# Patient Record
Sex: Female | Born: 1975
Health system: Southern US, Community
[De-identification: ages and names within clinical notes are randomized; demographics above are authoritative.]

## PROBLEM LIST (undated history)

## (undated) DIAGNOSIS — Z9049 Acquired absence of other specified parts of digestive tract: Secondary | ICD-10-CM

## (undated) DIAGNOSIS — I10 Essential (primary) hypertension: Principal | ICD-10-CM

## (undated) HISTORY — PX: BACK SURGERY: SHX140

## (undated) HISTORY — PX: KNEE ARTHROSCOPY: SHX127

## (undated) HISTORY — PX: HERNIA REPAIR: SHX51

## (undated) HISTORY — DX: Acquired absence of other specified parts of digestive tract: Z90.49

## (undated) HISTORY — PX: APPENDECTOMY: SHX54

## (undated) HISTORY — DX: Essential (primary) hypertension: I10

---

## 2012-01-14 ENCOUNTER — Emergency Department (HOSPITAL_COMMUNITY)
Admission: EM | Admit: 2012-01-14 | Discharge: 2012-01-14 | Disposition: A | Discharge status: Home / Self Care | Payer: 59 | Attending: Emergency Medicine | Admitting: Emergency Medicine

## 2012-01-14 ENCOUNTER — Encounter (HOSPITAL_COMMUNITY): Payer: Self-pay | Admitting: Emergency Medicine

## 2012-01-14 DIAGNOSIS — R51 Headache: Secondary | ICD-10-CM | POA: Insufficient documentation

## 2012-01-14 DIAGNOSIS — F17200 Nicotine dependence, unspecified, uncomplicated: Secondary | ICD-10-CM | POA: Insufficient documentation

## 2012-01-14 MED ORDER — DEXAMETHASONE SODIUM PHOSPHATE 10 MG/ML IJ SOLN
10.0000 mg | Freq: Once | INTRAMUSCULAR | Status: AC
Start: 2012-01-14 — End: 2012-01-14
  Administered 2012-01-14: 10 mg via INTRAMUSCULAR
  Filled 2012-01-14: qty 1

## 2012-01-14 MED ORDER — KETOROLAC TROMETHAMINE 60 MG/2ML IM SOLN
60.0000 mg | Freq: Once | INTRAMUSCULAR | Status: AC
  Administered 2012-01-14: 60 mg via INTRAMUSCULAR
  Filled 2012-01-14: qty 2

## 2012-01-14 NOTE — ED Provider Notes (Signed)
History     CSN: [id number]  Arrival date & time 01/14/12  0907   First MD Initiated Contact with Patient 01/14/12 [id number]      Chief Complaint  Patient presents with  . Headache    (Consider location/radiation/quality/duration/timing/severity/associated sxs/prior treatment) HPI  Pt presents to the ED with complaints of headache. Pt was seen at an Urgent Care yesterday for severe headache and elevated BP. The patients blood pressure is normally 120/80 and recently has been between 130-147/80-90. Pt was given an unknown pain shot and Phenergan. She states that her headache today is mild but she checked her blood pressure and its in the mid 130's which is concerning to her since its normally not elevated. SHe denies focal or generalized weakness, change in vision, difficulty talking/walking, denies photo- and phonophobia. Pt has a history of having headaches and this is similar to the same. She mainly came in concern of having HBP and HA at the same time.  History reviewed. No pertinent past medical history.  Past Surgical History  Procedure Date  . Back surgery     No family history on file.  History  Substance Use Topics  . Smoking status: Current Everyday Smoker  . Smokeless tobacco: Not on file  . Alcohol Use: Yes    OB History    Grav Para Term Preterm Abortions TAB SAB Ect Mult Living                  Review of Systems  All other systems reviewed and are negative.    Allergies  Review of patient's allergies indicates no known allergies.  Home Medications   Current Outpatient Rx  Name Route Sig Dispense Refill  . ASPIRIN 325 MG PO TABS Oral Take 325-650 mg by mouth every 4 (four) hours as needed. For headache    . HAIR/SKIN/NAILS PO Oral Take 1 tablet by mouth daily.      BP 147/80  Pulse 62  Temp(Src) 98.3 F (36.8 C) (Oral)  Resp 20  SpO2 95%  Physical Exam  Nursing note and vitals reviewed. Constitutional: She appears well-developed and  well-nourished.  HENT:  Head: Normocephalic and atraumatic.  Eyes: Conjunctivae are normal. Pupils are equal, round, and reactive to light.  Neck: Trachea normal, normal range of motion and full passive range of motion without pain. Neck supple.  Cardiovascular: Normal rate, regular rhythm and normal pulses.   Pulmonary/Chest: Effort normal and breath sounds normal. Chest wall is not dull to percussion. She exhibits no tenderness, no crepitus, no edema, no deformity and no retraction.  Abdominal: Soft. Normal appearance.  Musculoskeletal: Normal range of motion.  Neurological: She is alert. She has normal strength. No cranial nerve deficit or sensory deficit.  Skin: Skin is warm, dry and intact.  Psychiatric: She has a normal mood and affect. Her speech is normal and behavior is normal. Judgment and thought content normal. Cognition and memory are normal.    ED Course  Procedures (including critical care time)  Labs Reviewed - No data to display No results found.   1. Headache       MDM     Patient Complaints #1 headache #2 high blood pressure  Time of re-evaluation Pt given a shot of Toradol and 10mg  Dexamethasone. Twenty minutes after medication given, the patients headache is 100% resolved. She still feels tired.  Plan  Pt given a referral to [facility] for a follow-up appoint on her headaches and high blood pressure. I have  a low suspicion for intracranial abnormalities. Pts pain was very mild when she came in and her blood pressure is mildly elevated.  Referrals [facility]  Prescriptions Not given any prescriptions. Pt given Imitrex at Urgent Car yesterday buts he has not gotten it filled yet.   Continue these medications as noted  Figley, Shawana  Home Medication Instructions [id number]   Printed on:01/14/12 1121  Medication Information                    aspirin 325 MG tablet Take 325-650 mg by mouth every 4 (four)  hours as needed. For headache           Multiple Vitamins-Minerals (HAIR/SKIN/NAILS PO) Take 1 tablet by mouth daily.             Pt has been given return to ER precautions. Pt is agreeable with plan and will return to the ED as needed for any changing or worsening of symptoms.        Dorthula Matas, PA 01/14/12 1124

## 2012-01-14 NOTE — Discharge Instructions (Signed)
Headache, General, Unknown Cause The specific cause of your headache may not have been found today. There are many causes and types of headache. A few common ones are:  Tension headache.   Migraine.   Infections (examples: dental and sinus infections).   Bone and/or joint problems in the neck or jaw.   Depression.   Eye problems.  These headaches are not life threatening.  Headaches can sometimes be diagnosed by a patient history and a physical exam. Sometimes, lab and imaging studies (such as x-ray and/or CT scan) are used to rule out more serious problems. In some cases, a spinal tap (lumbar puncture) may be requested. There are many times when your exam and tests may be normal on the first visit even when there is a serious problem causing your headaches. Because of that, it is very important to follow up with your doctor or local clinic for further evaluation. FINDING OUT THE RESULTS OF TESTS  If a radiology test was performed, a radiologist will review your results.   You will be contacted by the emergency department or your physician if any test results require a change in your treatment plan.   Not all test results may be available during your visit. If your test results are not back during the visit, make an appointment with your caregiver to find out the results. Do not assume everything is normal if you have not heard from your caregiver or the medical facility. It is important for you to follow up on all of your test results.  HOME CARE INSTRUCTIONS   Keep follow-up appointments with your caregiver, or any specialist referral.   Only take over-the-counter or prescription medicines for pain, discomfort, or fever as directed by your caregiver.   Biofeedback, massage, or other relaxation techniques may be helpful.   Ice packs or heat applied to the head and neck can be used. Do this three to four times per day, or as needed.   Call your doctor if you have any questions or  concerns.   If you smoke, you should quit.  SEEK MEDICAL CARE IF:   You develop problems with medications prescribed.   You do not respond to or obtain relief from medications.   You have a change from the usual headache.   You develop nausea or vomiting.  SEEK IMMEDIATE MEDICAL CARE IF:   If your headache becomes severe.   You have an unexplained oral temperature above 102 F (38.9 C), or as your caregiver suggests.   You have a stiff neck.   You have loss of vision.   You have muscular weakness.   You have loss of muscular control.   You develop severe symptoms different from your first symptoms.   You start losing your balance or have trouble walking.   You feel faint or pass out.  MAKE SURE YOU:   Understand these instructions.   Will watch your condition.   Will get help right away if you are not doing well or get worse.  Document Released: 11/15/2005 Document Revised: 07/28/2011 Document Reviewed: 07/04/2008 [facility] Patient Information 2012 [place], [place].Headaches, Frequently Asked Questions MIGRAINE HEADACHES Q: What is migraine? What causes it? How can I treat it? A: Generally, migraine headaches begin as a dull ache. Then they develop into a constant, throbbing, and pulsating pain. You may experience pain at the temples. You may experience pain at the front or back of one or both sides of the head. The pain is usually accompanied  can I treat it?  A: Generally, migraine headaches begin as a dull ache. Then they develop into a constant, throbbing, and pulsating pain. You may experience pain at the temples. You may experience pain at the front or back of one or both sides of the head. The pain is usually accompanied by a combination of:   Nausea.    Vomiting.    Sensitivity to light and noise.   Some people (about 15%) experience an aura (see below) before an attack. The cause of migraine is believed to be chemical reactions in the brain. Treatment for migraine may include over-the-counter or prescription medications. It may also include self-help techniques. These include relaxation training and biofeedback.    Q: What is an aura?   A: About 15% of people with migraine get an "aura". This is a sign of neurological symptoms that occur before a migraine headache. You may see wavy or jagged lines, dots, or flashing lights. You might experience tunnel vision or blind spots in one or both eyes. The aura can include visual or auditory hallucinations (something imagined). It may include disruptions in smell (such as strange odors), taste or touch. Other symptoms include:   Numbness.    A "pins and needles" sensation.    Difficulty in recalling or speaking the correct word.   These neurological events may last as long as 60 minutes. These symptoms will fade as the headache begins.  Q: What is a trigger?  A: Certain physical or environmental factors can lead to or "trigger" a migraine. These include:   Foods.    Hormonal changes.    Weather.    Stress.   It is important to remember that triggers are different for everyone. To help prevent migraine attacks, you need to figure out which triggers affect you. Keep a headache diary. This is a good way to track triggers. The diary will help you talk to your healthcare professional about your condition.  Q: Does weather affect migraines?  A: Bright sunshine, hot, humid conditions, and drastic changes in barometric pressure may lead to, or "trigger," a migraine attack in some people. But studies have shown that weather does not act as a trigger for everyone with migraines.  Q: What is the link between migraine and hormones?  A: Hormones start and regulate many of your body's functions. Hormones keep your body in balance within a constantly changing environment. The levels of hormones in your body are unbalanced at times. Examples are during menstruation, pregnancy, or menopause. That can lead to a migraine attack. In fact, about three quarters of all women with migraine report that their attacks are related to the menstrual cycle.    Q: Is there an increased risk of stroke for migraine sufferers?   A: The likelihood of a migraine attack causing a stroke is very remote. That is not to say that migraine sufferers cannot have a stroke associated with their migraines. In persons under age 40, the most common associated factor for stroke is migraine headache. But over the course of a person's normal life span, the occurrence of migraine headache may actually be associated with a reduced risk of dying from cerebrovascular disease due to stroke.    Q: What are acute medications for migraine?  A: Acute medications are used to treat the pain of the headache after it has started. Examples over-the-counter medications, NSAIDs, ergots, and triptans.    Q: What are the triptans?  A: Triptans are the newest class of abortive   medications. They are specifically targeted to treat migraine. Triptans are vasoconstrictors. They moderate some chemical reactions in the brain. The triptans work on receptors in your brain. Triptans help to restore the balance of a neurotransmitter called serotonin. Fluctuations in levels of serotonin are thought to be a main cause of migraine.    Q: Are over-the-counter medications for migraine effective?  A: Over-the-counter, or "OTC," medications may be effective in relieving mild to moderate pain and associated symptoms of migraine. But you should see your caregiver before beginning any treatment regimen for migraine.    Q: What are preventive medications for migraine?  A: Preventive medications for migraine are sometimes referred to as "prophylactic" treatments. They are used to reduce the frequency, severity, and length of migraine attacks. Examples of preventive medications include antiepileptic medications, antidepressants, beta-blockers, calcium channel blockers, and NSAIDs (nonsteroidal anti-inflammatory drugs).  Q: Why are anticonvulsants used to treat migraine?   A: During the past few years, there has been an increased interest in antiepileptic drugs for the prevention of migraine. They are sometimes referred to as "anticonvulsants". Both epilepsy and migraine may be caused by similar reactions in the brain.    Q: Why are antidepressants used to treat migraine?  A: Antidepressants are typically used to treat people with depression. They may reduce migraine frequency by regulating chemical levels, such as serotonin, in the brain.    Q: What alternative therapies are used to treat migraine?  A: The term "alternative therapies" is often used to describe treatments considered outside the scope of conventional Western medicine. Examples of alternative therapy include acupuncture, acupressure, and yoga. Another common alternative treatment is herbal therapy. Some herbs are believed to relieve headache pain. Always discuss alternative therapies with your caregiver before proceeding. Some herbal products contain arsenic and other toxins.  TENSION HEADACHES  Q: What is a tension-type headache? What causes it? How can I treat it?  A: Tension-type headaches occur randomly. They are often the result of temporary stress, anxiety, fatigue, or anger. Symptoms include soreness in your temples, a tightening band-like sensation around your head (a "vice-like" ache). Symptoms can also include a pulling feeling, pressure sensations, and contracting head and neck muscles. The headache begins in your forehead, temples, or the back of your head and neck. Treatment for tension-type headache may include over-the-counter or prescription medications. Treatment may also include self-help techniques such as relaxation training and biofeedback.  CLUSTER HEADACHES  Q: What is a cluster headache? What causes it? How can I treat it?   A: Cluster headache gets its name because the attacks come in groups. The pain arrives with little, if any, warning. It is usually on one side of the head. A tearing or bloodshot eye and a runny nose on the same side of the headache may also accompany the pain. Cluster headaches are believed to be caused by chemical reactions in the brain. They have been described as the most severe and intense of any headache type. Treatment for cluster headache includes prescription medication and oxygen.  SINUS HEADACHES  Q: What is a sinus headache? What causes it? How can I treat it?  A: When a cavity in the bones of the face and skull (a sinus) becomes inflamed, the inflammation will cause localized pain. This condition is usually the result of an allergic reaction, a tumor, or an infection. If your headache is caused by a sinus blockage, such as an infection, you will probably have a fever. An x-ray will confirm a sinus   that is being overused. Sometimes your caregiver will gradually substitute a different type of treatment or medication. Stopping may be a challenge. Regularly overusing a medication increases the potential for serious side effects. Consult a caregiver if you regularly use headache medications more than 2 days per week or more than the label advises. ADDITIONAL QUESTIONS AND ANSWERS Q: What is biofeedback? A: Biofeedback is a self-help treatment. Biofeedback uses special equipment to monitor your body's involuntary physical responses. Biofeedback monitors:  Breathing.   Pulse.   Heart rate.   Temperature.   Muscle tension.   Brain activity.  Biofeedback helps you refine and perfect your relaxation exercises. You learn to control the physical  responses that are related to stress. Once the technique has been mastered, you do not need the equipment any more. Q: Are headaches hereditary? A: Four out of five (80%) of people that suffer report a family history of migraine. Scientists are not sure if this is genetic or a family predisposition. Despite the uncertainty, a child has a 50% chance of having migraine if one parent suffers. The child has a 75% chance if both parents suffer.  Q: Can children get headaches? A: By the time they reach high school, most young people have experienced some type of headache. Many safe and effective approaches or medications can prevent a headache from occurring or stop it after it has begun.  Q: What type of doctor should I see to diagnose and treat my headache? A: Start with your primary caregiver. Discuss his or her experience and approach to headaches. Discuss methods of classification, diagnosis, and treatment. Your caregiver may decide to recommend you to a headache specialist, depending upon your symptoms or other physical conditions. Having diabetes, allergies, etc., may require a more comprehensive and inclusive approach to your headache. The National Headache Foundation will provide, upon request, a list of [facility] physician members in your state. Document Released: 02/05/2004 Document Revised: 07/28/2011 Document Reviewed: 07/15/2008 [facility] Patient Information 2012 [place], [place].Headache and Allergies The relationship between allergies and headaches is unclear. Many people with allergic or infectious nasal problems also have headaches (migraines or sinus headaches). However, sometimes allergies can cause pressure that feels like a headache, and sometimes headaches can cause allergy-like symptoms. It is not always clear whether your symptoms are caused by allergies or by a headache. CAUSES   Migraine: The cause of a migraine is not always known.   Sinus Headache: The cause of a sinus headache may be a  sinus infection. Other conditions that may be related to sinus headaches include:   Hay fever (allergic rhinitis).   Deviation of the nasal septum.   Swelling or clogging of the nasal passages.  SYMPTOMS  Migraine headache symptoms (which often last 4 to 72 hours) include:  Intense, throbbing pain on one or both sides of the head.   Nausea.   Vomiting.   Being extra sensitive to light.   Being extra sensitive to sound.   Nervous system reactions that appear similar to an allergic reaction:   Stuffy nose.   Runny nose.   Tearing.  Sinus headaches are felt as facial pain or pressure.  DIAGNOSIS  Because there is some overlap in symptoms, sinus and migraine headaches are often misdiagnosed. For example, a person with migraines may also feel facial pressure. Likewise, many people with hay fever may get migraine headaches rather than sinus headaches. These migraines can be triggered by the histamine release during an allergic reaction. An antihistamine medicine can  eliminate this pain. There are standard criteria that help clarify the difference between these headaches and related allergy or allergy-like symptoms. Your caregiver can use these criteria to determine the proper diagnosis and provide you the best care. TREATMENT  Migraine medicine may help people who have persistent migraine headaches even though their hay fever is controlled. For some people, anti-inflammatory treatments do not work to relieve migraines. Medicines called triptans (such as sumatriptan) can be helpful for those people. Document Released: 02/05/2004 Document Revised: 07/28/2011 Document Reviewed: 02/27/2010 [facility] Patient Information 2012 [place], [place].

## 2012-01-14 NOTE — ED Notes (Signed)
Was seen med center yesterday was given 2 shots still going on feels like her bp is up

## 2012-01-14 NOTE — ED Notes (Signed)
Ha/a x 2 months  Getting worse

## 2012-01-14 NOTE — ED Notes (Signed)
Pt verbalized understanding of d/c inst. NAD at d/c

## 2012-01-14 NOTE — ED Notes (Signed)
Patient states history if headaches in the past however one day ago seen at another facility given a shot for pain and nausea with a prescription for Imitrex.  States have not used the Imitrex yet.  Compared from one day to today pain is improved concerned why pain is still there and patient states feels kind of in a fog. Clear speech follows commands appropriate moves all extremities bilateral equal and strong. Airway intact bilateral equal chest rise and fall.

## 2012-01-18 ENCOUNTER — Encounter: Payer: Self-pay | Admitting: Physician Assistant

## 2012-01-18 ENCOUNTER — Ambulatory Visit (INDEPENDENT_AMBULATORY_CARE_PROVIDER_SITE_OTHER): Payer: 59 | Admitting: Physician Assistant

## 2012-01-18 DIAGNOSIS — R350 Frequency of micturition: Secondary | ICD-10-CM

## 2012-01-18 DIAGNOSIS — R5383 Other fatigue: Secondary | ICD-10-CM

## 2012-01-18 DIAGNOSIS — IMO0001 Reserved for inherently not codable concepts without codable children: Secondary | ICD-10-CM

## 2012-01-18 DIAGNOSIS — R030 Elevated blood-pressure reading, without diagnosis of hypertension: Secondary | ICD-10-CM

## 2012-01-18 DIAGNOSIS — R631 Polydipsia: Secondary | ICD-10-CM

## 2012-01-18 DIAGNOSIS — R5381 Other malaise: Secondary | ICD-10-CM

## 2012-01-18 DIAGNOSIS — G43909 Migraine, unspecified, not intractable, without status migrainosus: Secondary | ICD-10-CM

## 2012-01-18 LAB — POCT URINALYSIS DIPSTICK
Bilirubin, UA: NEGATIVE
Glucose, UA: NEGATIVE
Ketones, UA: NEGATIVE
Nitrite, UA: NEGATIVE
pH, UA: 6.5

## 2012-01-18 MED ORDER — PROPRANOLOL HCL 80 MG PO TABS: 80.0000 mg | ORAL_TABLET | Freq: Two times a day (BID) | ORAL | Status: DC

## 2012-01-18 NOTE — Patient Instructions (Addendum)
Start propranolol 80mg  twice a day. CBC today will call with results. Continue Imitrex 100mg  as needed. Follow up in 2 weeks for blood pressure recheck.   Migraine Headache A migraine headache is an intense, throbbing pain on one or both sides of your head. The exact cause of a migraine headache is not always known. A migraine may be caused when nerves in the brain become irritated and release chemicals that cause swelling within blood vessels, causing pain. Many migraine sufferers have a family history of migraines. Before you get a migraine you may or may not get an aura. An aura is a group of symptoms that can predict the beginning of a migraine. An aura may include:  Visual changes such as:   Flashing lights.   Bright spots or zig-zag lines.   Tunnel vision.   Feelings of numbness.   Trouble talking.   Muscle weakness.  SYMPTOMS  Pain on one or both sides of your head.   Pain that is pulsating or throbbing in nature.   Pain that is severe enough to prevent daily activities.   Pain that is aggravated by any daily physical activity.   Nausea (feeling sick to your stomach), vomiting, or both.   Pain with exposure to bright lights, loud noises, or activity.   General sensitivity to bright lights or loud noises.  MIGRAINE TRIGGERS Examples of triggers of migraine headaches include:   Alcohol.   Smoking.   Stress.   It may be related to menses (female menstruation).   Aged cheeses.   Foods or drinks that contain nitrates, glutamate, aspartame, or tyramine.   Lack of sleep.   Chocolate.   Caffeine.   Hunger.   Medications such as nitroglycerine (used to treat chest pain), birth control pills, estrogen, and some blood pressure medications.  DIAGNOSIS  A migraine headache is often diagnosed based on:  Symptoms.   Physical examination.   A computerized X-ray scan (computed tomography, CT) of your head.  TREATMENT  Medications can help prevent migraines if  they are recurrent or should they become recurrent. Your caregiver can help you with a medication or treatment program that will be helpful to you.   Lying down in a dark, quiet room may be helpful.   Keeping a headache diary may help you find a trend as to what may be triggering your headaches.  SEEK IMMEDIATE MEDICAL CARE IF:   You have confusion, personality changes or seizures.   You have headaches that wake you from sleep.   You have an increased frequency in your headaches.   You have a stiff neck.   You have a loss of vision.   You have muscle weakness.   You start losing your balance or have trouble walking.   You feel faint or pass out.  MAKE SURE YOU:   Understand these instructions.   Will watch your condition.   Will get help right away if you are not doing well or get worse.  Document Released: 11/15/2005 Document Revised: 07/28/2011 Document Reviewed: 07/01/2009 [facility] Patient Information 2012 [place], [place].

## 2012-01-18 NOTE — Progress Notes (Signed)
  Subjective:    Patient ID: Sherry Curtis, female    DOB: [birth year], 36 y.o.   MRN: [id number]  HPI Patient presents to the clinic to establish care and to address new headaches. A few weeks ago patient was not feeling right and had a headache at work and had the nurse take her blood pressure and it was elevated. She was sent to a [facility] where she had 2 shots that she does not know what they were. Her headaches have continued on her left side of her head to the point where she went to the ER last Thursday. They also gave her a shot of something and imitrex. NO scans of her head have been done. Once they developed they stay for 4-6 hours the Imitrex given by ED has helped her so she can sleep and then when she wakes up her head doesn't hurt anymore. She does have a family history of HTN and Diabetes. The only thing she has changed in her diet is she has stopped red meat. She has not had any problems with her blood pressure in the past but has been anemic. She has gained more weight than usual over the last couple of months.     Review of Systems     Objective:   Physical Exam  Constitutional: She is oriented to person, place, and time. She appears well-developed and well-nourished.  HENT:  Head: Normocephalic and atraumatic.       Normal ROM of neck.  Cardiovascular: Normal rate, regular rhythm and normal heart sounds.   Pulmonary/Chest: Effort normal and breath sounds normal.  Abdominal: Soft. Bowel sounds are normal. There is no tenderness.  Neurological: She is alert and oriented to person, place, and time.  Psychiatric: She has a normal mood and affect.          Assessment & Plan:  Fatigue- Will check CBC has had history of anemia. Will call with results. Will consider further eval if persist.  Increased thirst/Urinary frequency- UA with large blood. Patient is on her period. Glucose at 80. Could consider testing for Diabetes insipdus if symptoms persist. Patient is told to  follow if continues for more than 3 weeks.  Migraine- Continue with Imitrex at onset of headaches. Add propanolol for headache prevention and to help with blood pressure. Gave handout of triggers and told her to keep a close watch of common potential triggers and headaches. Follow up if worsening.   Elevated blood pressure- Blood pressure was taking again and was 130/90. Patient was put on propanolol and does not want to add another medication at this point. Will F/U in 2 weeks for blood pressure check.

## 2012-01-19 LAB — CBC WITH DIFFERENTIAL/PLATELET
Basophils Relative: 0 % (ref 0–1)
Eosinophils Absolute: 0.1 10*3/uL (ref 0.0–0.7)
HCT: 36.2 % (ref 36.0–46.0)
Hemoglobin: 11.6 g/dL — ABNORMAL LOW (ref 12.0–15.0)
Lymphs Abs: 2 10*3/uL (ref 0.7–4.0)
MCH: 28.7 pg (ref 26.0–34.0)
MCHC: 32 g/dL (ref 30.0–36.0)
Monocytes Absolute: 0.4 10*3/uL (ref 0.1–1.0)
Monocytes Relative: 9 % (ref 3–12)
Neutro Abs: 2.1 10*3/uL (ref 1.7–7.7)
Neutrophils Relative %: 45 % (ref 43–77)
RBC: 4.04 MIL/uL (ref 3.87–5.11)

## 2012-01-20 NOTE — ED Provider Notes (Signed)
Medical screening examination/treatment/procedure(s) were performed by non-physician practitioner and as supervising physician I was immediately available for consultation/collaboration.  Raeford Razor, MD 01/20/12 [phone]

## 2012-02-14 ENCOUNTER — Other Ambulatory Visit: Payer: Self-pay | Admitting: Physician Assistant

## 2012-02-15 ENCOUNTER — Other Ambulatory Visit: Payer: Self-pay | Admitting: *Deleted

## 2012-02-15 MED ORDER — PROPRANOLOL HCL 80 MG PO TABS: 80.0000 mg | ORAL_TABLET | Freq: Two times a day (BID) | ORAL | Status: DC

## 2012-03-15 ENCOUNTER — Other Ambulatory Visit: Payer: Self-pay | Admitting: Physician Assistant

## 2012-03-17 ENCOUNTER — Other Ambulatory Visit: Payer: Self-pay | Admitting: *Deleted

## 2012-03-17 MED ORDER — PROPRANOLOL HCL 80 MG PO TABS: 80.0000 mg | ORAL_TABLET | Freq: Two times a day (BID) | ORAL | Status: DC

## 2012-04-13 ENCOUNTER — Other Ambulatory Visit: Payer: Self-pay | Admitting: Physician Assistant

## 2012-04-17 ENCOUNTER — Other Ambulatory Visit: Payer: Self-pay | Admitting: *Deleted

## 2012-04-17 MED ORDER — PROPRANOLOL HCL 80 MG PO TABS: 80.0000 mg | ORAL_TABLET | Freq: Two times a day (BID) | ORAL | Status: DC

## 2012-08-07 ENCOUNTER — Ambulatory Visit (INDEPENDENT_AMBULATORY_CARE_PROVIDER_SITE_OTHER): Payer: 59 | Admitting: Physician Assistant

## 2012-08-07 ENCOUNTER — Encounter: Payer: Self-pay | Admitting: Physician Assistant

## 2012-08-07 VITALS — BP 122/83 | HR 88 | Ht 67.0 in | Wt 201.0 lb

## 2012-08-07 DIAGNOSIS — R600 Localized edema: Secondary | ICD-10-CM

## 2012-08-07 DIAGNOSIS — R350 Frequency of micturition: Secondary | ICD-10-CM

## 2012-08-07 DIAGNOSIS — R609 Edema, unspecified: Secondary | ICD-10-CM

## 2012-08-07 DIAGNOSIS — R61 Generalized hyperhidrosis: Secondary | ICD-10-CM

## 2012-08-07 DIAGNOSIS — I10 Essential (primary) hypertension: Secondary | ICD-10-CM

## 2012-08-07 DIAGNOSIS — IMO0001 Reserved for inherently not codable concepts without codable children: Secondary | ICD-10-CM

## 2012-08-07 DIAGNOSIS — G43909 Migraine, unspecified, not intractable, without status migrainosus: Secondary | ICD-10-CM

## 2012-08-07 MED ORDER — SUMATRIPTAN SUCCINATE 100 MG PO TABS: 100.0000 mg | ORAL_TABLET | ORAL | Status: DC | PRN

## 2012-08-07 NOTE — Progress Notes (Signed)
  Subjective:    Patient ID: Sherry Curtis, female    DOB: 11-15-1976, 36 y.o.   MRN: [id number]  HPI Patient presents to the clinic to followup on migraines, urinary frequency, and hypertension. Patient does not feel well today. Last visit was in February or place her on propanolol that would potentially help prevent her migraines and decrease her blood pressure. The propanolol has helped decrease her blood pressure significantly and is within normal range now. Her migraines are still very frequent at on average 2 days a week. Imitrex does work but she ran out a couple of months ago. She describes the migraines as left side and frontal sinuses only. She has had a recent eye exam with in the year and was normal. She has started to experience auras when she has migraine. Imitrex does help but ran out.  She has also felt like she is gaining more weight especially in the face and sweating even when it is not that hot of a temperature. She also urinates multiple times a day. Denies any pain with urination. She is drinking what she normally drinks and has a normal appetite. She was check her sugar with a fasting glucose of 80. She feels jittery all the time. All these symptoms have been going on for over 6 months.   Review of Systems     Objective:   Physical Exam  Constitutional: She is oriented to person, place, and time. She appears well-developed and well-nourished.  HENT:  Head: Normocephalic and atraumatic.  Eyes: Conjunctivae are normal.       Facial edema is present. Almost in the moon shape appearance.  Neck: Normal range of motion. Neck supple. No thyromegaly present.  Cardiovascular: Normal rate, regular rhythm and normal heart sounds.   Pulmonary/Chest: Effort normal and breath sounds normal.       No CVA tenderness.  Neurological: She is alert and oriented to person, place, and time.  Skin: Skin is warm and dry.  Psychiatric: She has a normal mood and affect. Her behavior is normal.            Assessment & Plan:  Urinary frequency/Migraines/sweating/HTN- Pt really wants to decrease propanolol because she does not think it is helping her Migraines and she thinks that might be why she is having all these other symptoms. I reassured her that I do not think these other symptoms are being caused by propanolol. I did allow her to decrease to 40mg  twice a day to see if she did feel better and Migraines did not increase. Will recheck BP in one month to make sure stable. I am going to evaluate her for thyroid issues, I want to look at her ADH secretion, and get a cortisol level. The edema around her face is very concerning for some type of adrenal gland malfunction. Pt is aware of testing and informed that we will recheck in 1-2 months. I did refill Imitrex for patient to use as needed for migraines. Pt is to call if migraines worsen.

## 2012-08-07 NOTE — Patient Instructions (Addendum)
Decrease propanolol to 1/2 tab twice a day.

## 2012-08-08 LAB — TSH: TSH: 1.738 u[IU]/mL (ref 0.350–4.500)

## 2012-08-08 LAB — CORTISOL: Cortisol, Plasma: 5.9 ug/dL

## 2012-08-09 ENCOUNTER — Other Ambulatory Visit: Payer: Self-pay | Admitting: Physician Assistant

## 2012-08-09 MED ORDER — OXYBUTYNIN CHLORIDE 5 MG PO TABS
5.0000 mg | ORAL_TABLET | Freq: Two times a day (BID) | ORAL | Status: DC
Start: 2012-08-09 — End: 2012-10-11

## 2012-08-19 LAB — ARGININE VASOPRESSIN HORMONE: Arginine Vasopressin: 1.4 pg/mL

## 2012-08-25 ENCOUNTER — Other Ambulatory Visit: Payer: Self-pay | Admitting: Family Medicine

## 2012-10-11 ENCOUNTER — Other Ambulatory Visit: Payer: Self-pay | Admitting: Physician Assistant

## 2012-11-26 ENCOUNTER — Other Ambulatory Visit: Payer: Self-pay | Admitting: Physician Assistant

## 2013-01-27 ENCOUNTER — Other Ambulatory Visit: Payer: Self-pay | Admitting: Physician Assistant

## 2013-01-31 ENCOUNTER — Other Ambulatory Visit: Payer: Self-pay | Admitting: Physician Assistant

## 2014-05-05 DIAGNOSIS — Z9049 Acquired absence of other specified parts of digestive tract: Secondary | ICD-10-CM

## 2014-05-05 HISTORY — DX: Acquired absence of other specified parts of digestive tract: Z90.49

## 2014-05-20 ENCOUNTER — Encounter: Payer: Self-pay | Admitting: Physician Assistant

## 2014-07-15 ENCOUNTER — Encounter: Payer: Self-pay | Admitting: Physician Assistant

## 2014-07-15 ENCOUNTER — Ambulatory Visit (INDEPENDENT_AMBULATORY_CARE_PROVIDER_SITE_OTHER): Payer: 59 | Admitting: Physician Assistant

## 2014-07-15 VITALS — BP 127/79 | HR 97 | Ht 68.0 in | Wt 206.0 lb

## 2014-07-15 DIAGNOSIS — R5383 Other fatigue: Principal | ICD-10-CM

## 2014-07-15 DIAGNOSIS — G43001 Migraine without aura, not intractable, with status migrainosus: Secondary | ICD-10-CM

## 2014-07-15 DIAGNOSIS — M2550 Pain in unspecified joint: Secondary | ICD-10-CM

## 2014-07-15 DIAGNOSIS — R5381 Other malaise: Secondary | ICD-10-CM

## 2014-07-15 DIAGNOSIS — Z131 Encounter for screening for diabetes mellitus: Secondary | ICD-10-CM

## 2014-07-15 DIAGNOSIS — M791 Myalgia, unspecified site: Secondary | ICD-10-CM

## 2014-07-15 DIAGNOSIS — IMO0001 Reserved for inherently not codable concepts without codable children: Secondary | ICD-10-CM

## 2014-07-15 DIAGNOSIS — Z13220 Encounter for screening for lipoid disorders: Secondary | ICD-10-CM

## 2014-07-15 MED ORDER — RIZATRIPTAN BENZOATE 10 MG PO TABS
10.0000 mg | ORAL_TABLET | ORAL | Status: DC | PRN
Start: 2014-07-15 — End: 2014-10-16

## 2014-07-16 DIAGNOSIS — R5381 Other malaise: Secondary | ICD-10-CM | POA: Insufficient documentation

## 2014-07-16 DIAGNOSIS — G43001 Migraine without aura, not intractable, with status migrainosus: Secondary | ICD-10-CM | POA: Insufficient documentation

## 2014-07-16 DIAGNOSIS — M2550 Pain in unspecified joint: Secondary | ICD-10-CM | POA: Insufficient documentation

## 2014-07-16 DIAGNOSIS — M791 Myalgia, unspecified site: Secondary | ICD-10-CM | POA: Insufficient documentation

## 2014-07-16 DIAGNOSIS — R5383 Other fatigue: Principal | ICD-10-CM

## 2014-07-16 LAB — CBC WITH DIFFERENTIAL/PLATELET
BASOS ABS: 0 10*3/uL (ref 0.0–0.1)
Basophils Relative: 1 % (ref 0–1)
Eosinophils Absolute: 0.1 10*3/uL (ref 0.0–0.7)
Eosinophils Relative: 2 % (ref 0–5)
HEMATOCRIT: 32.2 % — AB (ref 36.0–46.0)
HEMOGLOBIN: 11.3 g/dL — AB (ref 12.0–15.0)
LYMPHS PCT: 40 % (ref 12–46)
Lymphs Abs: 1.9 10*3/uL (ref 0.7–4.0)
MCH: 30.3 pg (ref 26.0–34.0)
MCHC: 35.1 g/dL (ref 30.0–36.0)
MCV: 86.3 fL (ref 78.0–100.0)
MONO ABS: 0.4 10*3/uL (ref 0.1–1.0)
MONOS PCT: 8 % (ref 3–12)
NEUTROS ABS: 2.4 10*3/uL (ref 1.7–7.7)
Neutrophils Relative %: 49 % (ref 43–77)
Platelets: 379 10*3/uL (ref 150–400)
RBC: 3.73 MIL/uL — AB (ref 3.87–5.11)
RDW: 16.2 % — ABNORMAL HIGH (ref 11.5–15.5)
WBC: 4.8 10*3/uL (ref 4.0–10.5)

## 2014-07-16 LAB — COMPLETE METABOLIC PANEL WITH GFR
ALK PHOS: 64 U/L (ref 39–117)
ALT: 12 U/L (ref 0–35)
AST: 19 U/L (ref 0–37)
Albumin: 4.2 g/dL (ref 3.5–5.2)
BILIRUBIN TOTAL: 0.4 mg/dL (ref 0.2–1.2)
BUN: 6 mg/dL (ref 6–23)
CO2: 25 meq/L (ref 19–32)
Calcium: 9.1 mg/dL (ref 8.4–10.5)
Chloride: 103 mEq/L (ref 96–112)
Creat: 0.75 mg/dL (ref 0.50–1.10)
GFR, Est African American: >89 mL/min
GFR, Est Non African American: >89 mL/min
Glucose, Bld: 71 mg/dL (ref 70–99)
Potassium: 4.4 mEq/L (ref 3.5–5.3)
SODIUM: 137 meq/L (ref 135–145)
TOTAL PROTEIN: 7.8 g/dL (ref 6.0–8.3)

## 2014-07-16 LAB — LIPID PANEL
CHOL/HDL RATIO: 2.8 ratio
Cholesterol: 157 mg/dL (ref 0–200)
HDL: 56 mg/dL (ref >39)
LDL CALC: 93 mg/dL (ref 0–99)
Triglycerides: 42 mg/dL (ref <150)
VLDL: 8 mg/dL (ref 0–40)

## 2014-07-16 NOTE — Progress Notes (Signed)
   Subjective:    Patient ID: Sherry Curtis, female    DOB: 11/04/1976, 38 y.o.   MRN: [id number]  HPI Pt presents to the clinic to discuss ongoing fatigue, stiffness and muscle aches. She has noticed symptoms for last year but they seem to be getting worse and worse. Per pt 5 years ago when she started working 3rd shift she has started to feel worse and worse. She works every night midnight to 8, stays awake until 2 and then sleeps until 11:00. She feels like she has no life. She does not exercise. Pt admits to feeling down but also that she is just so achy all over. She states her muscles even ache. Not tried anything to make better. No specific joint or muscle hurts. She states "she feels like an old woman".  Migraine- not having them regularly. Maybe 1 a month. imitrex made her feel worse and "heavy". Not tried anything else.    Review of Systems  All other systems reviewed and are negative.      Objective:   Physical Exam  Constitutional: She is oriented to person, place, and time. She appears well-developed and well-nourished.  HENT:  Head: Normocephalic and atraumatic.  Cardiovascular: Normal rate, regular rhythm and normal heart sounds.   Pulmonary/Chest: Effort normal and breath sounds normal.  Musculoskeletal: Normal range of motion.  Neurological: She is alert and oriented to person, place, and time.  Skin: Skin is dry.  Psychiatric: She has a normal mood and affect. Her behavior is normal.          Assessment & Plan:  Fatigue/myalgia/arthralgia- will check labs. PHQ-9 was 4. I feel like pt is depressed. She really has no quality of life and misses out on things her family does. She never sees husband due to work schedule. i would like for her to consider SSRI. Exercise could also make her feel much better if she could make time for it. Encouraged pt to look into making plans to change jobs or do something she wants to do. She is aware something needs to change. Follow upin  4-6 weeks.   Migraine- will try maxalt. Follow up as needed.   Screening labs ordered today.

## 2014-07-17 ENCOUNTER — Other Ambulatory Visit: Payer: Self-pay | Admitting: Physician Assistant

## 2014-07-17 ENCOUNTER — Encounter: Payer: Self-pay | Admitting: Physician Assistant

## 2014-07-17 DIAGNOSIS — E559 Vitamin D deficiency, unspecified: Secondary | ICD-10-CM | POA: Insufficient documentation

## 2014-07-17 DIAGNOSIS — D509 Iron deficiency anemia, unspecified: Secondary | ICD-10-CM | POA: Insufficient documentation

## 2014-07-17 LAB — VITAMIN B12: Vitamin B-12: 1101 pg/mL — ABNORMAL HIGH (ref 211–911)

## 2014-07-17 LAB — FERRITIN: FERRITIN: 6 ng/mL — AB (ref 10–291)

## 2014-07-17 LAB — VITAMIN D 25 HYDROXY (VIT D DEFICIENCY, FRACTURES): VIT D 25 HYDROXY: 17 ng/mL — AB (ref 30–89)

## 2014-07-17 LAB — TSH: TSH: 1.526 u[IU]/mL (ref 0.350–4.500)

## 2014-07-17 MED ORDER — VITAMIN D (ERGOCALCIFEROL) 1.25 MG (50000 UNIT) PO CAPS: 50000.0000 [IU] | ORAL_CAPSULE | ORAL | Status: DC

## 2014-09-09 ENCOUNTER — Telehealth: Payer: Self-pay | Admitting: *Deleted

## 2014-09-09 DIAGNOSIS — D509 Iron deficiency anemia, unspecified: Secondary | ICD-10-CM

## 2014-09-09 DIAGNOSIS — E559 Vitamin D deficiency, unspecified: Secondary | ICD-10-CM

## 2014-09-09 NOTE — Telephone Encounter (Signed)
Labs ordered to recheck b12, vit d, and iron.

## 2014-09-10 LAB — CBC WITH DIFFERENTIAL/PLATELET
BASOS ABS: 0 10*3/uL (ref 0.0–0.1)
Basophils Relative: 0 % (ref 0–1)
Eosinophils Absolute: 0.1 10*3/uL (ref 0.0–0.7)
Eosinophils Relative: 1 % (ref 0–5)
HEMATOCRIT: 35.9 % — AB (ref 36.0–46.0)
HEMOGLOBIN: 12 g/dL (ref 12.0–15.0)
LYMPHS PCT: 41 % (ref 12–46)
Lymphs Abs: 2.6 10*3/uL (ref 0.7–4.0)
MCH: 30.2 pg (ref 26.0–34.0)
MCHC: 33.4 g/dL (ref 30.0–36.0)
MCV: 90.4 fL (ref 78.0–100.0)
MONO ABS: 0.5 10*3/uL (ref 0.1–1.0)
Monocytes Relative: 8 % (ref 3–12)
NEUTROS ABS: 3.2 10*3/uL (ref 1.7–7.7)
NEUTROS PCT: 50 % (ref 43–77)
Platelets: 304 10*3/uL (ref 150–400)
RBC: 3.97 MIL/uL (ref 3.87–5.11)
RDW: 16.9 % — AB (ref 11.5–15.5)
WBC: 6.3 10*3/uL (ref 4.0–10.5)

## 2014-09-10 LAB — FERRITIN: FERRITIN: 19 ng/mL (ref 10–291)

## 2014-09-11 LAB — VITAMIN D 25 HYDROXY (VIT D DEFICIENCY, FRACTURES): VIT D 25 HYDROXY: 26 ng/mL — AB (ref 30–89)

## 2014-10-16 ENCOUNTER — Other Ambulatory Visit: Payer: Self-pay | Admitting: Physician Assistant

## 2014-10-24 ENCOUNTER — Other Ambulatory Visit: Payer: Self-pay | Admitting: Physician Assistant

## 2017-04-07 DIAGNOSIS — H538 Other visual disturbances: Secondary | ICD-10-CM | POA: Diagnosis not present

## 2017-04-07 DIAGNOSIS — D649 Anemia, unspecified: Secondary | ICD-10-CM | POA: Diagnosis not present

## 2017-04-07 DIAGNOSIS — R0602 Shortness of breath: Secondary | ICD-10-CM | POA: Diagnosis not present

## 2017-04-07 DIAGNOSIS — I10 Essential (primary) hypertension: Secondary | ICD-10-CM | POA: Diagnosis not present

## 2017-04-07 DIAGNOSIS — M791 Myalgia: Secondary | ICD-10-CM | POA: Diagnosis not present

## 2017-04-07 DIAGNOSIS — R0789 Other chest pain: Secondary | ICD-10-CM | POA: Diagnosis not present

## 2017-04-07 DIAGNOSIS — R1013 Epigastric pain: Secondary | ICD-10-CM | POA: Diagnosis not present

## 2017-04-07 DIAGNOSIS — R079 Chest pain, unspecified: Secondary | ICD-10-CM | POA: Diagnosis not present

## 2017-04-07 DIAGNOSIS — R200 Anesthesia of skin: Secondary | ICD-10-CM | POA: Diagnosis not present

## 2017-04-07 DIAGNOSIS — F419 Anxiety disorder, unspecified: Secondary | ICD-10-CM | POA: Diagnosis not present

## 2017-05-23 DIAGNOSIS — H5213 Myopia, bilateral: Secondary | ICD-10-CM | POA: Diagnosis not present

## 2018-01-26 DIAGNOSIS — Z23 Encounter for immunization: Secondary | ICD-10-CM | POA: Diagnosis not present

## 2018-07-08 DIAGNOSIS — H5213 Myopia, bilateral: Secondary | ICD-10-CM | POA: Diagnosis not present

## 2018-07-17 DIAGNOSIS — R42 Dizziness and giddiness: Secondary | ICD-10-CM | POA: Diagnosis not present

## 2018-08-11 ENCOUNTER — Encounter: Payer: Self-pay | Admitting: Physician Assistant

## 2018-08-11 ENCOUNTER — Ambulatory Visit (INDEPENDENT_AMBULATORY_CARE_PROVIDER_SITE_OTHER): Payer: BLUE CROSS/BLUE SHIELD | Admitting: Physician Assistant

## 2018-08-11 VITALS — BP 150/95 | HR 86 | Ht 68.0 in | Wt 179.0 lb

## 2018-08-11 DIAGNOSIS — D508 Other iron deficiency anemias: Secondary | ICD-10-CM

## 2018-08-11 DIAGNOSIS — G43001 Migraine without aura, not intractable, with status migrainosus: Secondary | ICD-10-CM

## 2018-08-11 DIAGNOSIS — E559 Vitamin D deficiency, unspecified: Secondary | ICD-10-CM

## 2018-08-11 DIAGNOSIS — I10 Essential (primary) hypertension: Secondary | ICD-10-CM | POA: Diagnosis not present

## 2018-08-11 DIAGNOSIS — F5101 Primary insomnia: Secondary | ICD-10-CM | POA: Diagnosis not present

## 2018-08-11 HISTORY — DX: Essential (primary) hypertension: I10

## 2018-08-11 MED ORDER — LISINOPRIL 10 MG PO TABS: 10.0000 mg | ORAL_TABLET | Freq: Every day | ORAL | 0 refills | Status: DC

## 2018-08-11 MED ORDER — TRAZODONE HCL 50 MG PO TABS: 50.0000 mg | ORAL_TABLET | Freq: Every day | ORAL | 1 refills | Status: DC

## 2018-08-11 NOTE — Patient Instructions (Addendum)
Start lisinopril 10mg  daily.  Start back on ferrous sulfate(iron) Start back on vitamin d 1000 units daily.

## 2018-08-11 NOTE — Progress Notes (Signed)
Subjective:    Patient ID: Sherry Curtis, female    DOB: 1975-12-31, 42 y.o.   MRN: [id number]   HPI  Pt is a 42 yo female who presents to the clinic to reestablish care.  Patient has a medical history positive for migraines, hypertension, anemia, vitamin D deficiency.  She is recently not felt very well.  She has been having a lot of headaches and dizziness.  She has a history of hypertension but her blood pressure has been controlled for a while.  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  She is not on any medications.  There has been no changes in her diet.  She denies any increase in salt.  The only thing that has been different is her stress level.  She has been a little more stress recently.  She denies any chest pains, palpitations, vision changes.  In fact she just went to the doctor and they said her eyes have improved and function.  She did recently get her labs drawn and she brings those in today to go over.  She does have a history of anemia and she has had very long menstrual cycles recently.  She admits she is not taking any over-the-counter iron or trying to supplement in her diet.  Overall her migraines have improved.  She does not use any preventative and she uses Excedrin Migraine for rescue.  Patient is struggling with going to sleep.  When she gets to sleep she has no problems staying asleep.  She has tried melatonin in the past but it does not seem to help very much.  She feels like she struggles to wind down.  .. Active Ambulatory Problems    Diagnosis Date Noted  . Migraine without aura and with status migrainosus, not intractable 07/16/2014  . Other malaise and fatigue 07/16/2014  . Myalgia 07/16/2014  . Arthralgia 07/16/2014  . Anemia, iron deficiency 07/17/2014  . Vitamin D deficiency 07/17/2014  . Primary insomnia 08/11/2018  . Essential hypertension 08/11/2018   Resolved  Ambulatory Problems    Diagnosis Date Noted  . No Resolved Ambulatory Problems   Past Medical History:  Diagnosis Date  . S/P appendectomy 05/05/2014   .Marland Kitchen. Family History  Problem Relation Age of Onset  . Diabetes Mother   . Hyperlipidemia Mother   . Diabetes Maternal Grandmother      Review of Systems  All other systems reviewed and are negative.      Objective:   Physical Exam  Constitutional: She is oriented to person, place, and time. She appears well-developed and well-nourished.  HENT:  Head: Normocephalic and atraumatic.  Neck: No thyromegaly present.  Cardiovascular: Normal rate and regular rhythm.  Pulmonary/Chest: Effort normal and breath sounds normal.  Neurological: She is alert and oriented to person, place, and time.  Psychiatric: She has a normal mood and affect. Her behavior is normal.          Assessment & Plan:  Marland Kitchen.Marland Kitchen.Diagnoses and all orders for this visit:  Essential hypertension -     lisinopril (PRINIVIL,ZESTRIL) 10 MG tablet; Take 1 tablet (10 mg total) by mouth daily.  Iron deficiency anemia secondary to inadequate dietary iron intake  Primary insomnia -     traZODone (DESYREL) 50 MG tablet; Take 1 tablet (50 mg total) by mouth at bedtime.  Vitamin D deficiency  Migraine without aura and with  status migrainosus, not intractable  .Marland Kitchen Depression screen [facility] 2/9 08/11/2018  Decreased Interest 0  Down, Depressed, Hopeless 0  PHQ - 2 Score 0  Altered sleeping 0  Tired, decreased energy 0  Change in appetite 0  Feeling bad or failure about yourself  0  Trouble concentrating 0  Moving slowly or fidgety/restless 0  Suicidal thoughts 0  PHQ-9 Score 0  Difficult doing work/chores Not difficult at all   Blood pressure is elevated today.  Discussed causes of blood pressure elevation.  Will start lisinopril 10 mg.  Discussed side effects of lisinopril.  Follow-up in 1 month.  Discussed good sleep routine and ways to transition into a better  nights rest.  I did give her trazodone to try as needed.  Discussed side effects of trazodone.  I did notice that her hemoglobin was low.  She has a history of iron deficiency anemia.  I suggested that she start back with an iron supplement.  She could also increase iron rich foods.  Certainly she will probably notice a dip in energy around her cycle since they are lasting at least 7 days.  Strongly encouraged her to go ahead and get back on vitamin D and even adding a B complex to this to help with her energy level.  In the next month we can begin to work more on other causes for low energy if they are not improving how patient feels.  As always strongly encouraged regular exercise.    Pt does come in with recent labs drawn: hgb 10.4 A!C 5.5 Scr .80 LDL 88 HDL 62 TG 64  Will abstract labs.   Had a recent Pap about 2 months ago in [place] [facility].  We will submit to get record of this.

## 2018-09-04 ENCOUNTER — Other Ambulatory Visit: Payer: Self-pay | Admitting: Physician Assistant

## 2018-09-04 DIAGNOSIS — I10 Essential (primary) hypertension: Secondary | ICD-10-CM

## 2018-09-05 ENCOUNTER — Telehealth: Payer: Self-pay

## 2018-09-05 NOTE — Telephone Encounter (Signed)
Attempted to obtain records from [facility] and they faxed back that they have no records to send back. Georgiann Cocker, CMA.

## 2018-09-05 NOTE — Telephone Encounter (Signed)
NEEDS APPOINTMENT FOR BP F/U

## 2018-09-18 ENCOUNTER — Ambulatory Visit: Payer: BLUE CROSS/BLUE SHIELD | Admitting: Physician Assistant

## 2018-09-18 ENCOUNTER — Encounter: Payer: Self-pay | Admitting: Physician Assistant

## 2018-09-18 DIAGNOSIS — I10 Essential (primary) hypertension: Secondary | ICD-10-CM | POA: Diagnosis not present

## 2018-09-18 MED ORDER — LISINOPRIL 10 MG PO TABS
10.0000 mg | ORAL_TABLET | Freq: Every day | ORAL | 3 refills | Status: DC
Start: 2018-09-18 — End: 2019-10-01

## 2018-09-18 NOTE — Progress Notes (Signed)
   Subjective:    Patient ID: Sherry Curtis, female    DOB: Jan 31, 1976, 42 y.o.   MRN: [id number]  HPI  Pt is a 41 yo female with HTN who presents to the clinic for follow up and medication refill.   She was started on lisinopril about 4 weeks ago. She is doing well without any complaints. She feels much better when her BP is controlled. She is checking and brings in log with BP's in the 100/120 over 70/80s. She denies any cough, palpitations, headaches.  .. Active Ambulatory Problems    Diagnosis Date Noted  . Migraine without aura and with status migrainosus, not intractable 07/16/2014  . Other malaise and fatigue 07/16/2014  . Myalgia 07/16/2014  . Arthralgia 07/16/2014  . Anemia, iron deficiency 07/17/2014  . Vitamin D deficiency 07/17/2014  . Primary insomnia 08/11/2018  . Essential hypertension 08/11/2018   Resolved Ambulatory Problems    Diagnosis Date Noted  . No Resolved Ambulatory Problems   Past Medical History:  Diagnosis Date  . S/P appendectomy 05/05/2014     Review of Systems See HPI>     Objective:   Physical Exam  Constitutional: She is oriented to person, place, and time. She appears well-developed and well-nourished.  HENT:  Head: Normocephalic and atraumatic.  Cardiovascular: Normal rate and regular rhythm.  Pulmonary/Chest: Effort normal and breath sounds normal.  Neurological: She is alert and oriented to person, place, and time.  Skin: Skin is warm.  Psychiatric: She has a normal mood and affect. Her behavior is normal.          Assessment & Plan:  Marland KitchenMarland KitchenDiagnoses and all orders for this visit:  Essential hypertension -     lisinopril (PRINIVIL,ZESTRIL) 10 MG tablet; Take 1 tablet (10 mg total) by mouth daily. -     BASIC METABOLIC PANEL WITH GFR   Check BMP. BP looks great today. Refilled for 1 year.

## 2018-09-19 LAB — BASIC METABOLIC PANEL WITH GFR
BUN / CREAT RATIO: 6 (calc) (ref 6–22)
BUN: 5 mg/dL — ABNORMAL LOW (ref 7–25)
CALCIUM: 9.3 mg/dL (ref 8.6–10.2)
CO2: 26 mmol/L (ref 20–32)
Chloride: 104 mmol/L (ref 98–110)
Creat: 0.89 mg/dL (ref 0.50–1.10)
GFR, Est African American: 92 mL/min/{1.73_m2} (ref >=60)
GFR, Est Non African American: 79 mL/min/{1.73_m2} (ref >=60)
GLUCOSE: 84 mg/dL (ref 65–99)
Potassium: 4 mmol/L (ref 3.5–5.3)
SODIUM: 138 mmol/L (ref 135–146)

## 2018-09-19 NOTE — Progress Notes (Signed)
Wonderful. 

## 2018-12-25 ENCOUNTER — Ambulatory Visit (INDEPENDENT_AMBULATORY_CARE_PROVIDER_SITE_OTHER): Payer: BLUE CROSS/BLUE SHIELD | Admitting: Physician Assistant

## 2018-12-25 ENCOUNTER — Encounter: Payer: Self-pay | Admitting: Physician Assistant

## 2018-12-25 VITALS — BP 126/84 | HR 77 | Temp 98.4°F | Wt 162.0 lb

## 2018-12-25 DIAGNOSIS — Z20828 Contact with and (suspected) exposure to other viral communicable diseases: Secondary | ICD-10-CM

## 2018-12-25 DIAGNOSIS — R6889 Other general symptoms and signs: Secondary | ICD-10-CM

## 2018-12-25 NOTE — Patient Instructions (Signed)

## 2018-12-25 NOTE — Progress Notes (Signed)
Subjective:    Patient ID: Sherry Curtis, female    DOB: Aug 10, 1976, 43 y.o.   MRN: [id number]  HPI: This is a 43 year old female who presents today with myalgias and sick contacts with influenza. She has been feeling sick since Thursday with body aches, fever, chills, cough, sore throat. She has been taking Mucinex for symptomatic relief. She denies N/V, diarrhea. She does feel 80 percent better today.   .. Active Ambulatory Problems    Diagnosis Date Noted  . Migraine without aura and with status migrainosus, not intractable 07/16/2014  . Other malaise and fatigue 07/16/2014  . Myalgia 07/16/2014  . Arthralgia 07/16/2014  . Anemia, iron deficiency 07/17/2014  . Vitamin D deficiency 07/17/2014  . Primary insomnia 08/11/2018  . Essential hypertension 08/11/2018   Resolved Ambulatory Problems    Diagnosis Date Noted  . No Resolved Ambulatory Problems   Past Medical History:  Diagnosis Date  . S/P appendectomy 05/05/2014       Review of Systems  Constitutional: Positive for activity change, chills and fatigue.  HENT: Positive for congestion, rhinorrhea and sore throat. Negative for ear pain, sinus pressure, sinus pain and sneezing.   Eyes: Negative for discharge and itching.  Respiratory: Positive for cough.   Gastrointestinal: Negative for diarrhea, nausea and vomiting.  Musculoskeletal: Positive for myalgias. Negative for arthralgias, neck pain and neck stiffness.       Objective:   Physical Exam Vitals signs reviewed.  Constitutional:      General: She is not in acute distress.    Appearance: She is not ill-appearing.  HENT:     Head: Normocephalic and atraumatic.     Right Ear: Tympanic membrane, ear canal and external ear normal. There is no impacted cerumen.     Left Ear: Tympanic membrane, ear canal and external ear normal. There is no impacted cerumen.     Nose: Congestion and rhinorrhea present.     Mouth/Throat:     Mouth: Mucous membranes are moist.   Pharynx: Oropharynx is clear. No oropharyngeal exudate or posterior oropharyngeal erythema.  Eyes:     General: No scleral icterus.       Right eye: No discharge.        Left eye: No discharge.     Extraocular Movements: Extraocular movements intact.     Conjunctiva/sclera: Conjunctivae normal.     Pupils: Pupils are equal, round, and reactive to light.  Neck:     Musculoskeletal: Normal range of motion and neck supple. No neck rigidity or muscular tenderness.  Cardiovascular:     Rate and Rhythm: Normal rate.     Pulses: Normal pulses.     Heart sounds: Normal heart sounds.  Pulmonary:     Effort: Pulmonary effort is normal. No respiratory distress.     Breath sounds: Normal breath sounds. No wheezing, rhonchi or rales.  Abdominal:     General: Abdomen is flat. Bowel sounds are normal. There is no distension.     Tenderness: There is no abdominal tenderness. There is no guarding.  Lymphadenopathy:     Cervical: Cervical adenopathy present.  Skin:    General: Skin is warm and dry.     Capillary Refill: Capillary refill takes less than 2 seconds.     Coloration: Skin is not jaundiced.     Findings: No bruising or rash.  Neurological:     Mental Status: She is alert.     Coordination: Coordination normal.     Gait: Gait  normal.  Psychiatric:        Mood and Affect: Mood normal.        Behavior: Behavior normal.        Thought Content: Thought content normal.           Assessment & Plan:  Marland Kitchen.Marland Kitchen.Sherry Curtis was seen today for cough.  Diagnoses and all orders for this visit:  Influenza-like symptoms  Exposure to the flu    patient understands that she has passed the influenza treatment window and decides to opt out of flu swab. Her symptoms do sound consistent with the flu.  -Continue Mucinex -Start Flonase -written out for Friday and today. May return back to work tomorrow.  -reassured vitals and physical exam seems stable today.   Contact if symptoms worsen or fail to  improve within another 7-10 days.   Marland Kitchen.Harlon Flor.I, Sherry Tates PA-C, have reviewed and agree with the above documentation in it's entirety.

## 2019-01-02 ENCOUNTER — Other Ambulatory Visit: Payer: Self-pay | Admitting: Physician Assistant

## 2019-01-02 ENCOUNTER — Telehealth: Payer: Self-pay | Admitting: Physician Assistant

## 2019-01-02 DIAGNOSIS — R059 Cough, unspecified: Secondary | ICD-10-CM

## 2019-01-02 DIAGNOSIS — R05 Cough: Secondary | ICD-10-CM

## 2019-01-02 MED ORDER — HYDROCOD POLST-CPM POLST ER 10-8 MG/5ML PO SUER: 5.0000 mL | Freq: Two times a day (BID) | ORAL | 0 refills | Status: DC | PRN

## 2019-01-02 MED ORDER — BENZONATATE 200 MG PO CAPS: 200.0000 mg | ORAL_CAPSULE | Freq: Two times a day (BID) | ORAL | 0 refills | Status: DC | PRN

## 2019-01-02 NOTE — Telephone Encounter (Signed)
Left brief VM for daughter to call back to get clarification on the medication that Lesly Rubenstein has sent in and for the patient to get a chest x-ray per [facility] recommendations.

## 2019-01-02 NOTE — Telephone Encounter (Signed)
I saw her on 1/27 but things can change. I would like to get a CXR(ok to order) first to confirm nothing is developing. Is cough lingering but overall feeling better? Or feeling worse? I will send tessalon pearls during the day use and a small quanity of cough syrup at bedtime.

## 2019-01-02 NOTE — Progress Notes (Signed)
..  PDMP reviewed during this encounter.  Seen 1/27 persistent cough. Will get CXR sent cough syrup and tessalon.   Tandy Gaw PA-C

## 2019-01-02 NOTE — Telephone Encounter (Signed)
Patient's daughter Douglass Rivers) called on her mother's behalf and she states that her mom is having some cough and rattling sounds in her chest. Daughter is requesting a cough medication be sent to the pharmacy. I advised her that you may need to see her in the office. Please advise.

## 2019-01-03 ENCOUNTER — Ambulatory Visit (INDEPENDENT_AMBULATORY_CARE_PROVIDER_SITE_OTHER): Payer: BLUE CROSS/BLUE SHIELD

## 2019-01-03 DIAGNOSIS — R05 Cough: Secondary | ICD-10-CM | POA: Diagnosis not present

## 2019-01-03 DIAGNOSIS — R0989 Other specified symptoms and signs involving the circulatory and respiratory systems: Secondary | ICD-10-CM | POA: Diagnosis not present

## 2019-01-03 NOTE — Telephone Encounter (Signed)
I called the patient's daughter  to follow up and she states that she had got my message and the daughter received my message. She will have her mom stop by to get the xray today.

## 2019-01-03 NOTE — Progress Notes (Signed)
Great news CXR looks great.

## 2019-08-11 DIAGNOSIS — H5213 Myopia, bilateral: Secondary | ICD-10-CM | POA: Diagnosis not present

## 2019-09-30 ENCOUNTER — Other Ambulatory Visit: Payer: Self-pay | Admitting: Physician Assistant

## 2019-09-30 DIAGNOSIS — I10 Essential (primary) hypertension: Secondary | ICD-10-CM

## 2019-11-01 ENCOUNTER — Inpatient Hospital Stay
Admission: RE | Admit: 2019-11-01 | Discharge: 2019-11-01 | Disposition: A | Discharge status: Home / Self Care | Payer: BLUE CROSS/BLUE SHIELD | Source: Ambulatory Visit

## 2019-11-01 ENCOUNTER — Other Ambulatory Visit: Payer: Self-pay

## 2019-11-01 ENCOUNTER — Emergency Department (INDEPENDENT_AMBULATORY_CARE_PROVIDER_SITE_OTHER)
Admission: EM | Admit: 2019-11-01 | Discharge: 2019-11-01 | Disposition: A | Discharge status: Home / Self Care | Payer: BC Managed Care – PPO | Source: Home / Self Care

## 2019-11-01 DIAGNOSIS — J029 Acute pharyngitis, unspecified: Secondary | ICD-10-CM | POA: Diagnosis not present

## 2019-11-01 DIAGNOSIS — J069 Acute upper respiratory infection, unspecified: Secondary | ICD-10-CM

## 2019-11-01 LAB — POC SARS CORONAVIRUS 2 AG -  ED: SARS Coronavirus 2 Ag: NEGATIVE

## 2019-11-01 NOTE — Discharge Instructions (Addendum)
Return if any problems.

## 2019-11-01 NOTE — ED Triage Notes (Addendum)
Pt c/o headache, sore throat and chest aching x 3 days. 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

## 2019-11-01 NOTE — ED Provider Notes (Signed)
[facility]    CSN: [id number] Arrival date & time: 11/01/19  1405      History   Chief Complaint Chief Complaint  Patient presents with  . Headache  . Sore Throat  . Chest Pain    aching    HPI Sherry Curtis is a 43 y.o. female.   The history is provided by the patient. No language interpreter was used.  Headache Pain location:  Generalized Radiates to:  Does not radiate Onset quality:  Gradual Timing:  Constant Progression:  Worsening Chronicity:  New Relieved by:  Nothing Worsened by:  Nothing Sore Throat Associated symptoms include chest pain and headaches.  Chest Pain Associated symptoms: headache   Pt reports she was exposed to a coworker who has covid.  Pt complains of a sore throat.   Past Medical History:  Diagnosis Date  . Essential hypertension 08/11/2018  . S/P appendectomy 05/05/2014    Patient Active Problem List   Diagnosis Date Noted  . Primary insomnia 08/11/2018  . Essential hypertension 08/11/2018  . Anemia, iron deficiency 07/17/2014  . Vitamin D deficiency 07/17/2014  . Migraine without aura and with status migrainosus, not intractable 07/16/2014  . Other malaise and fatigue 07/16/2014  . Myalgia 07/16/2014  . Arthralgia 07/16/2014    Past Surgical History:  Procedure Laterality Date  . APPENDECTOMY    . BACK SURGERY    . HERNIA REPAIR      OB History   No obstetric history on file.      Home Medications    Prior to Admission medications   Medication Sig Start Date End Date Taking? Authorizing Provider  benzonatate (TESSALON) 200 MG capsule Take 1 capsule (200 mg total) by mouth 2 (two) times daily as needed for cough. 01/02/19   Breeback, Jade L, PA-C  chlorpheniramine-HYDROcodone (TUSSIONEX) 10-8 MG/5ML SUER Take 5 mLs by mouth every 12 (twelve) hours as needed for cough (cough, will cause drowsiness.). 01/02/19   Breeback, Jade L, PA-C  lisinopril (ZESTRIL) 10 MG tablet Take 1 tablet (10 mg total) by mouth daily.  NEEDS APPT/LABS 10/01/19   Donella Stade, PA-C  traZODone (DESYREL) 50 MG tablet Take 1 tablet (50 mg total) by mouth at bedtime. 08/11/18   Donella Stade, PA-C    Family History Family History  Problem Relation Age of Onset  . Diabetes Mother   . Hyperlipidemia Mother   . Diabetes Maternal Grandmother     Social History Social History   Tobacco Use  . Smoking status: Never Smoker  . Smokeless tobacco: Never Used  Substance Use Topics  . Alcohol use: Yes  . Drug use: Never     Allergies   Patient has no known allergies.   Review of Systems Review of Systems  Cardiovascular: Positive for chest pain.  Neurological: Positive for headaches.  All other systems reviewed and are negative.    Physical Exam Triage Vital Signs ED Triage Vitals [11/01/19 1427]  Enc Vitals Group     BP 131/80     Pulse Rate 94     Resp 16     Temp 98.3 F (36.8 C)     Temp Source Oral     SpO2 100 %     Weight 160 lb (72.6 kg)     Height 5\' 8"  (1.727 m)     Head Circumference      Peak Flow      Pain Score 0     Pain Loc  Pain Edu?      Excl. in GC?    No data found.  Updated Vital Signs BP 131/80 (BP Location: Right Arm)   Pulse 94   Temp 98.3 F (36.8 C) (Oral)   Resp 16   Ht 5\' 8"  (1.727 m)   Wt 72.6 kg   LMP  (LMP Unknown)   SpO2 100%   BMI 24.33 kg/m   Visual Acuity Right Eye Distance:   Left Eye Distance:   Bilateral Distance:    Right Eye Near:   Left Eye Near:    Bilateral Near:     Physical Exam Vitals signs and nursing note reviewed.  Constitutional:      Appearance: She is well-developed.  HENT:     Head: Normocephalic.  Eyes:     Extraocular Movements: Extraocular movements intact.     Pupils: Pupils are equal, round, and reactive to light.  Neck:     Musculoskeletal: Normal range of motion.  Cardiovascular:     Rate and Rhythm: Normal rate.  Pulmonary:     Effort: Pulmonary effort is normal.  Abdominal:     General: There is no  distension.  Musculoskeletal: Normal range of motion.  Neurological:     Mental Status: She is alert and oriented to person, place, and time.  Psychiatric:        Mood and Affect: Mood normal.      UC Treatments / Results  Labs (all labs ordered are listed, but only abnormal results are displayed) Labs Reviewed  NOVEL CORONAVIRUS, NAA  POC SARS CORONAVIRUS 2 AG -  ED    EKG   Radiology No results found.  Procedures Procedures (including critical care time)  Medications Ordered in UC Medications - No data to display  Initial Impression / Assessment and Plan / UC Course  I have reviewed the triage vital signs and the nursing notes.  Pertinent labs & imaging results that were available during my care of the patient were reviewed by me and considered in my medical decision making (see chart for details).     MDM: Covid rapid test negative. Pt advised to quarantine due to illness and exposure  Final Clinical Impressions(s) / UC Diagnoses   Final diagnoses:  Acute pharyngitis, unspecified etiology  Viral upper respiratory tract infection     Discharge Instructions     Return if any problems    ED Prescriptions    None     PDMP not reviewed this encounter.  An After Visit Summary was printed and given to the patient.    , Elson Areas 11/01/19 1912

## 2019-11-04 ENCOUNTER — Encounter: Payer: Self-pay | Admitting: Physician Assistant

## 2019-11-04 LAB — NOVEL CORONAVIRUS, NAA: SARS-CoV-2, NAA: NOT DETECTED

## 2019-12-22 ENCOUNTER — Other Ambulatory Visit: Payer: Self-pay | Admitting: Physician Assistant

## 2019-12-22 DIAGNOSIS — I10 Essential (primary) hypertension: Secondary | ICD-10-CM

## 2020-01-30 ENCOUNTER — Other Ambulatory Visit: Payer: Self-pay | Admitting: Physician Assistant

## 2020-01-30 DIAGNOSIS — I10 Essential (primary) hypertension: Secondary | ICD-10-CM

## 2020-02-01 ENCOUNTER — Other Ambulatory Visit (HOSPITAL_COMMUNITY)
Admission: RE | Admit: 2020-02-01 | Discharge: 2020-02-01 | Disposition: A | Discharge status: Home / Self Care | Payer: BC Managed Care – PPO | Source: Ambulatory Visit | Attending: Physician Assistant | Admitting: Physician Assistant

## 2020-02-01 ENCOUNTER — Ambulatory Visit (INDEPENDENT_AMBULATORY_CARE_PROVIDER_SITE_OTHER): Payer: BC Managed Care – PPO | Admitting: Physician Assistant

## 2020-02-01 ENCOUNTER — Encounter: Payer: Self-pay | Admitting: Physician Assistant

## 2020-02-01 ENCOUNTER — Other Ambulatory Visit: Payer: Self-pay

## 2020-02-01 VITALS — BP 145/75 | HR 87 | Ht 68.0 in | Wt 157.0 lb

## 2020-02-01 DIAGNOSIS — D508 Other iron deficiency anemias: Secondary | ICD-10-CM

## 2020-02-01 DIAGNOSIS — K5904 Chronic idiopathic constipation: Secondary | ICD-10-CM | POA: Insufficient documentation

## 2020-02-01 DIAGNOSIS — F5101 Primary insomnia: Secondary | ICD-10-CM | POA: Diagnosis not present

## 2020-02-01 DIAGNOSIS — Z0000 Encounter for general adult medical examination without abnormal findings: Secondary | ICD-10-CM

## 2020-02-01 DIAGNOSIS — Z124 Encounter for screening for malignant neoplasm of cervix: Secondary | ICD-10-CM

## 2020-02-01 DIAGNOSIS — Z1231 Encounter for screening mammogram for malignant neoplasm of breast: Secondary | ICD-10-CM

## 2020-02-01 DIAGNOSIS — E559 Vitamin D deficiency, unspecified: Secondary | ICD-10-CM

## 2020-02-01 DIAGNOSIS — I10 Essential (primary) hypertension: Secondary | ICD-10-CM

## 2020-02-01 DIAGNOSIS — Z113 Encounter for screening for infections with a predominantly sexual mode of transmission: Secondary | ICD-10-CM | POA: Insufficient documentation

## 2020-02-01 MED ORDER — FUSION 65-65-25-30 MG PO CAPS: 1.0000 | ORAL_CAPSULE | Freq: Every day | ORAL | 3 refills | Status: AC

## 2020-02-01 MED ORDER — BELSOMRA 10 MG PO TABS: 1.0000 | ORAL_TABLET | Freq: Every day | ORAL | 2 refills | Status: DC

## 2020-02-01 MED ORDER — LISINOPRIL 20 MG PO TABS: 20.0000 mg | ORAL_TABLET | Freq: Every day | ORAL | 3 refills | Status: DC

## 2020-02-01 NOTE — Progress Notes (Signed)
Subjective:     Sherry Curtis is a 44 y.o. female and is here for a comprehensive physical exam. The patient reports problems - continues to have constipation with iron supplement. continues to have problems sleeping taking 2 unisom at night to sleep. .trazodone did not help.   Social History   Socioeconomic History  . Marital status: Single    Spouse name: Not on file  . Number of children: Not on file  . Years of education: Not on file  . Highest education level: Not on file  Occupational History  . Not on file  Tobacco Use  . Smoking status: Never Smoker  . Smokeless tobacco: Never Used  Substance and Sexual Activity  . Alcohol use: Yes  . Drug use: Never  . Sexual activity: Not Currently  Other Topics Concern  . Not on file  Social History Narrative  . Not on file   Social Determinants of Health   Financial Resource Strain:   . Difficulty of Paying Living Expenses: Not on file  Food Insecurity:   . Worried About Programme researcher, broadcasting/film/video in the Last Year: Not on file  . Ran Out of Food in the Last Year: Not on file  Transportation Needs:   . Lack of Transportation (Medical): Not on file  . Lack of Transportation (Non-Medical): Not on file  Physical Activity:   . Days of Exercise per Week: Not on file  . Minutes of Exercise per Session: Not on file  Stress:   . Feeling of Stress : Not on file  Social Connections:   . Frequency of Communication with Friends and Family: Not on file  . Frequency of Social Gatherings with Friends and Family: Not on file  . Attends Religious Services: Not on file  . Active Member of Clubs or Organizations: Not on file  . Attends Banker Meetings: Not on file  . Marital Status: Not on file  Intimate Partner Violence:   . Fear of Current or Ex-Partner: Not on file  . Emotionally Abused: Not on file  . Physically Abused: Not on file  . Sexually Abused: Not on file   Health Maintenance  Topic Date Due  . HIV Screening   07/29/1990  . MAMMOGRAM  07/29/1993  . PAP SMEAR-Modifier  02/01/2020 (Originally 07/29/1996)  . INFLUENZA VACCINE  02/27/2020 (Originally 06/30/2019)  . TETANUS/TDAP  11/30/2023    The following portions of the patient's history were reviewed and updated as appropriate: allergies, current medications, past family history, past medical history, past social history, past surgical history and problem list.  Review of Systems A comprehensive review of systems was negative.   Objective:    BP (!) 145/75   Pulse 87   Ht 5\' 8"  (1.727 m)   Wt 157 lb (71.2 kg)   SpO2 100%   BMI 23.87 kg/m  General appearance: alert, cooperative and appears stated age Head: Normocephalic, without obvious abnormality, atraumatic Eyes: conjunctivae/corneas clear. PERRL, EOM's intact. Fundi benign. Ears: normal TM's and external ear canals both ears Nose: Nares normal. Septum midline. Mucosa normal. No drainage or sinus tenderness. Throat: lips, mucosa, and tongue normal; teeth and gums normal Neck: no adenopathy, no carotid bruit, no JVD, supple, symmetrical, trachea midline and thyroid not enlarged, symmetric, no tenderness/mass/nodules Back: symmetric, no curvature. ROM normal. No CVA tenderness. Lungs: clear to auscultation bilaterally Breasts: normal appearance, no masses or tenderness Heart: regular rate and rhythm, S1, S2 normal, no murmur, click, rub or gallop Abdomen: soft,  non-tender; bowel sounds normal; no masses,  no organomegaly Pelvic: cervix normal in appearance, external genitalia normal, no adnexal masses or tenderness, no cervical motion tenderness, uterus normal size, shape, and consistency and vagina normal without discharge Extremities: extremities normal, atraumatic, no cyanosis or edema Pulses: 2+ and symmetric Skin: Skin color, texture, turgor normal. No rashes or lesions Lymph nodes: Cervical, supraclavicular, and axillary nodes normal. Neurologic: Alert and oriented X 3, normal  strength and tone. Normal symmetric reflexes. Normal coordination and gait   .Marland Kitchen Depression screen [facility] 2/9 02/01/2020 08/11/2018  Decreased Interest 0 0  Down, Depressed, Hopeless 0 0  PHQ - 2 Score 0 0  Altered sleeping 0 0  Tired, decreased energy 0 0  Change in appetite 0 0  Feeling bad or failure about yourself  0 0  Trouble concentrating 0 0  Moving slowly or fidgety/restless 0 0  Suicidal thoughts 0 0  PHQ-9 Score 0 0  Difficult doing work/chores Not difficult at all Not difficult at all    Assessment:    Healthy female exam.      Plan:    Marland KitchenMarland KitchenAlfreda was seen today for annual exam.  Diagnoses and all orders for this visit:  Routine physical examination -     Lipid Panel w/reflex Direct LDL -     COMPLETE METABOLIC PANEL WITH GFR -     HIV antibody (with reflex) -     RPR -     TSH -     CBC -     VITAMIN D 25 Hydroxy (Vit-D Deficiency, Fractures)  Essential hypertension -     lisinopril (ZESTRIL) 20 MG tablet; Take 1 tablet (20 mg total) by mouth daily.  Visit for screening mammogram -     MM 3D SCREEN BREAST BILATERAL  Primary insomnia -     Suvorexant (BELSOMRA) 10 MG TABS; Take 1 tablet by mouth at bedtime.  Vitamin D deficiency -     VITAMIN D 25 Hydroxy (Vit-D Deficiency, Fractures)  Iron deficiency anemia secondary to inadequate dietary iron intake -     CBC -     Fe Fum-Fe Poly-Vit C-Lactobac (FUSION) 65-65-25-30 MG CAPS; Take 1 tablet by mouth daily.  Screening examination for STD (sexually transmitted disease) -     HIV antibody (with reflex) -     RPR -     Cytology - PAP  Chronic idiopathic constipation  Papanicolaou smear -     Cytology - PAP   .Marland Kitchen Discussed 150 minutes of exercise a week.  Encouraged vitamin D 1000 units and Calcium 1300mg  or 4 servings of dairy a day.  Fasting labs ordered.  Mammogram ordered.  Pap and STD testing done today.    BP a little elevated with elevated home readings. Increased lisinopril to 20mg  daily.    Pt has ongoing iron def anemia. Will evaluate with labs. Concern iron is causing constipation. Start fusion plus iron. Recheck in 3 months or sooner. If still having constipation issues consider linzess.   Discussed insomnia. Trazodone did not work. Consider belsomnra. Coupon card given. Follow up in 3 months.    See After Visit Summary for Counseling Recommendations

## 2020-02-01 NOTE — Patient Instructions (Signed)
Health Maintenance, Female Adopting a healthy lifestyle and getting preventive care are important in promoting health and wellness. Ask your health care provider about:  The right schedule for you to have regular tests and exams.  Things you can do on your own to prevent diseases and keep yourself healthy. What should I know about diet, weight, and exercise? Eat a healthy diet   Eat a diet that includes plenty of vegetables, fruits, low-fat dairy products, and lean protein.  Do not eat a lot of foods that are high in solid fats, added sugars, or sodium. Maintain a healthy weight Body mass index (BMI) is used to identify weight problems. It estimates body fat based on height and weight. Your health care provider can help determine your BMI and help you achieve or maintain a healthy weight. Get regular exercise Get regular exercise. This is one of the most important things you can do for your health. Most adults should:  Exercise for at least 150 minutes each week. The exercise should increase your heart rate and make you sweat (moderate-intensity exercise).  Do strengthening exercises at least twice a week. This is in addition to the moderate-intensity exercise.  Spend less time sitting. Even light physical activity can be beneficial. Watch cholesterol and blood lipids Have your blood tested for lipids and cholesterol at 45 years of age, then have this test every 5 years. Have your cholesterol levels checked more often if:  Your lipid or cholesterol levels are high.  You are older than 45 years of age.  You are at high risk for heart disease. What should I know about cancer screening? Depending on your health history and family history, you may need to have cancer screening at various ages. This may include screening for:  Breast cancer.  Cervical cancer.  Colorectal cancer.  Skin cancer.  Lung cancer. What should I know about heart disease, diabetes, and high blood  pressure? Blood pressure and heart disease  High blood pressure causes heart disease and increases the risk of stroke. This is more likely to develop in people who have high blood pressure readings, are of African descent, or are overweight.  Have your blood pressure checked: ? Every 3-5 years if you are 18-39 years of age. ? Every year if you are 40 years old or older. Diabetes Have regular diabetes screenings. This checks your fasting blood sugar level. Have the screening done:  Once every three years after age 40 if you are at a normal weight and have a low risk for diabetes.  More often and at a younger age if you are overweight or have a high risk for diabetes. What should I know about preventing infection? Hepatitis B If you have a higher risk for hepatitis B, you should be screened for this virus. Talk with your health care provider to find out if you are at risk for hepatitis B infection. Hepatitis C Testing is recommended for:  Everyone born from 1945 through 1965.  Anyone with known risk factors for hepatitis C. Sexually transmitted infections (STIs)  Get screened for STIs, including gonorrhea and chlamydia, if: ? You are sexually active and are younger than 45 years of age. ? You are older than 45 years of age and your health care provider tells you that you are at risk for this type of infection. ? Your sexual activity has changed since you were last screened, and you are at increased risk for chlamydia or gonorrhea. Ask your health care provider if   you are at risk.  Ask your health care provider about whether you are at high risk for HIV. Your health care provider may recommend a prescription medicine to help prevent HIV infection. If you choose to take medicine to prevent HIV, you should first get tested for HIV. You should then be tested every 3 months for as long as you are taking the medicine. Pregnancy  If you are about to stop having your period (premenopausal) and  you may become pregnant, seek counseling before you get pregnant.  Take 400 to 800 micrograms (mcg) of folic acid every day if you become pregnant.  Ask for birth control (contraception) if you want to prevent pregnancy. Osteoporosis and menopause Osteoporosis is a disease in which the bones lose minerals and strength with aging. This can result in bone fractures. If you are 65 years old or older, or if you are at risk for osteoporosis and fractures, ask your health care provider if you should:  Be screened for bone loss.  Take a calcium or vitamin D supplement to lower your risk of fractures.  Be given hormone replacement therapy (HRT) to treat symptoms of menopause. Follow these instructions at home: Lifestyle  Do not use any products that contain nicotine or tobacco, such as cigarettes, e-cigarettes, and chewing tobacco. If you need help quitting, ask your health care provider.  Do not use street drugs.  Do not share needles.  Ask your health care provider for help if you need support or information about quitting drugs. Alcohol use  Do not drink alcohol if: ? Your health care provider tells you not to drink. ? You are pregnant, may be pregnant, or are planning to become pregnant.  If you drink alcohol: ? Limit how much you use to 0-1 drink a day. ? Limit intake if you are breastfeeding.  Be aware of how much alcohol is in your drink. In the [place] one drink equals one 12 oz bottle of beer (355 mL), one 5 oz glass of wine (148 mL), or one 1 oz glass of hard liquor (44 mL). General instructions  Schedule regular health, dental, and eye exams.  Stay current with your vaccines.  Tell your health care provider if: ? You often feel depressed. ? You have ever been abused or do not feel safe at home. Summary  Adopting a healthy lifestyle and getting preventive care are important in promoting health and wellness.  Follow your health care provider's instructions about healthy  diet, exercising, and getting tested or screened for diseases.  Follow your health care provider's instructions on monitoring your cholesterol and blood pressure. This information is not intended to replace advice given to you by your health care provider. Make sure you discuss any questions you have with your health care provider. Document Revised: 11/08/2018 Document Reviewed: 11/08/2018 Elsevier Patient Education  2020 [employer]  

## 2020-02-04 LAB — LIPID PANEL W/REFLEX DIRECT LDL
Cholesterol: 144 mg/dL (ref <200)
HDL: 73 mg/dL (ref >=50)
LDL Cholesterol (Calc): 62 mg/dL (calc)
Non-HDL Cholesterol (Calc): 71 mg/dL (calc) (ref <130)
Total CHOL/HDL Ratio: 2 (calc) (ref <5.0)
Triglycerides: 31 mg/dL (ref <150)

## 2020-02-04 LAB — COMPLETE METABOLIC PANEL WITH GFR
AG Ratio: 1.4 (calc) (ref 1.0–2.5)
ALT: 10 U/L (ref 6–29)
AST: 18 U/L (ref 10–30)
Albumin: 4.1 g/dL (ref 3.6–5.1)
Alkaline phosphatase (APISO): 48 U/L (ref 31–125)
BUN: 7 mg/dL (ref 7–25)
CO2: 27 mmol/L (ref 20–32)
Calcium: 9.4 mg/dL (ref 8.6–10.2)
Chloride: 104 mmol/L (ref 98–110)
Creat: 0.8 mg/dL (ref 0.50–1.10)
GFR, Est African American: 104 mL/min/{1.73_m2} (ref >=60)
GFR, Est Non African American: 90 mL/min/{1.73_m2} (ref >=60)
Globulin: 3 g/dL (calc) (ref 1.9–3.7)
Glucose, Bld: 63 mg/dL — ABNORMAL LOW (ref 65–99)
Potassium: 4.6 mmol/L (ref 3.5–5.3)
Sodium: 139 mmol/L (ref 135–146)
Total Bilirubin: 0.5 mg/dL (ref 0.2–1.2)
Total Protein: 7.1 g/dL (ref 6.1–8.1)

## 2020-02-04 LAB — CBC
HCT: 36.5 % (ref 35.0–45.0)
Hemoglobin: 12.3 g/dL (ref 11.7–15.5)
MCH: 32.1 pg (ref 27.0–33.0)
MCHC: 33.7 g/dL (ref 32.0–36.0)
MCV: 95.3 fL (ref 80.0–100.0)
MPV: 11.2 fL (ref 7.5–12.5)
Platelets: 294 10*3/uL (ref 140–400)
RBC: 3.83 10*6/uL (ref 3.80–5.10)
RDW: 12.1 % (ref 11.0–15.0)
WBC: 4.1 10*3/uL (ref 3.8–10.8)

## 2020-02-04 LAB — VITAMIN D 25 HYDROXY (VIT D DEFICIENCY, FRACTURES): Vit D, 25-Hydroxy: 25 ng/mL — ABNORMAL LOW (ref 30–100)

## 2020-02-04 LAB — HIV ANTIBODY (ROUTINE TESTING W REFLEX): HIV 1&2 Ab, 4th Generation: NONREACTIVE

## 2020-02-04 LAB — TSH: TSH: 0.92 mIU/L

## 2020-02-04 LAB — RPR: RPR Ser Ql: NONREACTIVE

## 2020-02-04 NOTE — Progress Notes (Signed)
Sherry Curtis,   Your cholesterol looks great! STD negative.  Vitamin D just out of normal window but I want your vitamin D to be above 40 and it is 25. D3 1000 units daily with meals.  Hemoglobin looks good.  Thyroid looks good.  Kidney and liver look good.   Your glucose is low! Watch for hypoglycemia symptoms and make sure eating frequent small meals to keep your sugar up and feel better.

## 2020-02-05 LAB — CYTOLOGY - PAP
Chlamydia: NEGATIVE
Comment: NEGATIVE
Comment: NEGATIVE
Comment: NORMAL
Diagnosis: NEGATIVE
High risk HPV: NEGATIVE
Neisseria Gonorrhea: NEGATIVE

## 2020-02-06 ENCOUNTER — Other Ambulatory Visit: Payer: Self-pay

## 2020-02-06 ENCOUNTER — Ambulatory Visit (INDEPENDENT_AMBULATORY_CARE_PROVIDER_SITE_OTHER): Payer: BC Managed Care – PPO

## 2020-02-06 DIAGNOSIS — Z1231 Encounter for screening mammogram for malignant neoplasm of breast: Secondary | ICD-10-CM

## 2020-02-06 NOTE — Progress Notes (Signed)
Sherry Curtis,   Negative for any STD. Normal cells. And negative for HPV. Great news. Next pap in 5 years.

## 2020-02-07 NOTE — Progress Notes (Signed)
Sherry Curtis,  Radiology would like to follow up with a better image to evaluate some calcifications in both breast. They should be contacting you to schedule.

## 2020-02-08 ENCOUNTER — Other Ambulatory Visit: Payer: Self-pay | Admitting: Physician Assistant

## 2020-02-08 DIAGNOSIS — R928 Other abnormal and inconclusive findings on diagnostic imaging of breast: Secondary | ICD-10-CM

## 2020-02-20 ENCOUNTER — Ambulatory Visit: Payer: BC Managed Care – PPO

## 2020-02-20 ENCOUNTER — Other Ambulatory Visit: Payer: Self-pay | Admitting: Physician Assistant

## 2020-02-20 ENCOUNTER — Other Ambulatory Visit: Payer: Self-pay

## 2020-02-20 ENCOUNTER — Encounter: Payer: Self-pay | Admitting: Neurology

## 2020-02-20 ENCOUNTER — Ambulatory Visit
Admission: RE | Admit: 2020-02-20 | Discharge: 2020-02-20 | Disposition: A | Discharge status: Home / Self Care | Payer: BC Managed Care – PPO | Source: Ambulatory Visit | Attending: Physician Assistant | Admitting: Physician Assistant

## 2020-02-20 DIAGNOSIS — R921 Mammographic calcification found on diagnostic imaging of breast: Secondary | ICD-10-CM

## 2020-02-20 DIAGNOSIS — R928 Other abnormal and inconclusive findings on diagnostic imaging of breast: Secondary | ICD-10-CM

## 2020-02-20 NOTE — Progress Notes (Signed)
Make sure bilateral breast calcifications in EMR. Pt should have been contacted through imaging.

## 2020-02-25 DIAGNOSIS — Z20828 Contact with and (suspected) exposure to other viral communicable diseases: Secondary | ICD-10-CM | POA: Diagnosis not present

## 2020-05-05 ENCOUNTER — Ambulatory Visit (INDEPENDENT_AMBULATORY_CARE_PROVIDER_SITE_OTHER): Payer: BC Managed Care – PPO | Admitting: Physician Assistant

## 2020-05-05 ENCOUNTER — Encounter: Payer: Self-pay | Admitting: Physician Assistant

## 2020-05-05 ENCOUNTER — Other Ambulatory Visit: Payer: Self-pay

## 2020-05-05 VITALS — BP 124/68 | HR 92 | Temp 99.1°F | Ht 68.0 in | Wt 162.9 lb

## 2020-05-05 DIAGNOSIS — D508 Other iron deficiency anemias: Secondary | ICD-10-CM

## 2020-05-05 DIAGNOSIS — K5904 Chronic idiopathic constipation: Secondary | ICD-10-CM | POA: Diagnosis not present

## 2020-05-05 DIAGNOSIS — F5101 Primary insomnia: Secondary | ICD-10-CM

## 2020-05-05 DIAGNOSIS — I10 Essential (primary) hypertension: Secondary | ICD-10-CM | POA: Diagnosis not present

## 2020-05-05 DIAGNOSIS — G43001 Migraine without aura, not intractable, with status migrainosus: Secondary | ICD-10-CM | POA: Diagnosis not present

## 2020-05-05 MED ORDER — BELSOMRA 10 MG PO TABS: 1.0000 | ORAL_TABLET | Freq: Every day | ORAL | 1 refills | Status: AC

## 2020-05-05 NOTE — Progress Notes (Addendum)
Subjective:    Patient ID: Sherry Curtis, female    DOB: [birth year], 44 y.o.   MRN: [id number]  HPI  Sherry Curtis is a 44 year old female presenting for follow up of insomnia and fatigue. She states that she is continuing to sleep 6-7 hours a night and attributes this to busy lifestyle. She states she takes Unisom nightly for sleep and when that does not work she will take Belsomra. She states the Belsomra works well for her when needed. She does wake up some in the morning not feeling very rested. She endorses 1-2 migraines a month but is able to take excedrine migraine which resolves them. She also continues to endorse daily constipation necessitating suppositories. She states she has tried Metamucil, fiber supplements, Miralax, and juices but nothing has resolved her constipation. She does take iron daily.   Doing well on lisinopril. Taking daily. No CP, palpitations, headaches, vision changes, dizziness.   .. Active Ambulatory Problems    Diagnosis Date Noted  . Migraine without aura and with status migrainosus, not intractable 07/16/2014  . Other malaise and fatigue 07/16/2014  . Myalgia 07/16/2014  . Arthralgia 07/16/2014  . Anemia, iron deficiency 07/17/2014  . Vitamin D deficiency 07/17/2014  . Primary insomnia 08/11/2018  . Essential hypertension 08/11/2018  . Chronic idiopathic constipation 02/01/2020  . Breast calcifications 02/20/2020   Resolved Ambulatory Problems    Diagnosis Date Noted  . No Resolved Ambulatory Problems   Past Medical History:  Diagnosis Date  . S/P appendectomy 05/05/2014       Review of Systems  Constitutional: Positive for fatigue.  Gastrointestinal: Positive for constipation.  Neurological: Positive for headaches.  All other systems reviewed and are negative.      Objective:   Physical Exam Vitals reviewed.  Constitutional:      Appearance: Normal appearance. She is normal weight.  Eyes:     Extraocular Movements: Extraocular  movements intact.     Pupils: Pupils are equal, round, and reactive to light.  Cardiovascular:     Rate and Rhythm: Normal rate and regular rhythm.     Pulses: Normal pulses.     Heart sounds: Normal heart sounds.  Pulmonary:     Effort: Pulmonary effort is normal.     Breath sounds: Normal breath sounds.  Musculoskeletal:        General: Normal range of motion.     Cervical back: Normal range of motion.  Skin:    General: Skin is warm and dry.  Neurological:     General: No focal deficit present.     Mental Status: She is alert and oriented to person, place, and time.  Psychiatric:        Mood and Affect: Mood normal.        Behavior: Behavior normal.           Assessment & Plan:  1. Insomnia, fatigue - Take Belsomra every night instead of PRN. Hopefully this will provide more restful sleep. Refilled today for 6 months.  Will consider sleep study if no improvement in energy after full nights sleep.   2. Chronic Constipation - Continue with hydration, exercise, fiber supplementation. Patient has not switched from iron supplementation to Fusion supplementation due to pharmacy difficulties. She would like to try that supplement before trying Linzess. HO given.   3. HTN  - Continue Lisinopril 20mg . CMP is up to date.   4. Iron deficiency Anemia secondary to inadequate dietary iron intake - Start Fe Fum-Fe  Poly-Vit C-Lactobac (Fusion) 65-65-25-30MG  supplementation once daily  Follow up in 6 months.   Marland KitchenVernetta Honey PA-C, have reviewed and agree with the above documentation in it's entirety.

## 2020-05-05 NOTE — Patient Instructions (Signed)
Linaclotide oral capsules What is this medicine? LINACLOTIDE (lin a KLOE tide) is used to treat irritable bowel syndrome (IBS) with constipation as the main problem. It may also be used for relief of chronic constipation. This medicine may be used for other purposes; ask your health care provider or pharmacist if you have questions. COMMON BRAND NAME(S): Linzess What should I tell my health care provider before I take this medicine? They need to know if you have any of these conditions:  history of stool (fecal) impaction  now have diarrhea or have diarrhea often  other medical condition  stomach or intestinal disease, including bowel obstruction or abdominal adhesions  an unusual or allergic reaction to linaclotide, other medicines, foods, dyes, or preservatives  pregnant or trying to get pregnant  breast-feeding How should I use this medicine? Take this medicine by mouth with a glass of water. Follow the directions on the prescription label. Do not cut, crush or chew this medicine. Take on an empty stomach, at least 30 minutes before your first meal of the day. Take your medicine at regular intervals. Do not take your medicine more often than directed. Do not stop taking except on your doctor's advice. A special MedGuide will be given to you by the pharmacist with each prescription and refill. Be sure to read this information carefully each time. Talk to your pediatrician regarding the use of this medicine in children. This medicine is not approved for use in children. Overdosage: If you think you have taken too much of this medicine contact a poison control center or emergency room at once. NOTE: This medicine is only for you. Do not share this medicine with others. What if I miss a dose? If you miss a dose, just skip that dose. Wait until your next dose, and take only that dose. Do not take double or extra doses. What may interact with this medicine?  certain medicines for bowel  problems or bladder incontinence (these can cause constipation) This list may not describe all possible interactions. Give your health care provider a list of all the medicines, herbs, non-prescription drugs, or dietary supplements you use. Also tell them if you smoke, drink alcohol, or use illegal drugs. Some items may interact with your medicine. What should I watch for while using this medicine? Visit your doctor for regular check ups. Tell your doctor if your symptoms do not get better or if they get worse. Diarrhea is a common side effect of this medicine. It often begins within 2 weeks of starting this medicine. Stop taking this medicine and call your doctor if you get severe diarrhea. Stop taking this medicine and call your doctor or go to the nearest hospital emergency room right away if you develop unusual or severe stomach-area (abdominal) pain, especially if you also have bright red, bloody stools or black stools that look like tar. What side effects may I notice from receiving this medicine? Side effects that you should report to your doctor or health care professional as soon as possible:  allergic reactions like skin rash, itching or hives, swelling of the face, lips, or tongue  black, tarry stools  bloody or watery diarrhea  new or worsening stomach pain  severe or prolonged diarrhea Side effects that usually do not require medical attention (report to your doctor or health care professional if they continue or are bothersome):  bloating  gas  loose stools This list may not describe all possible side effects. Call your doctor for medical advice   about side effects. You may report side effects to FDA at 1-800-FDA-1088. Where should I keep my medicine? Keep out of the reach of children. Store at room temperature between 20 and 25 degrees C (68 and 77 degrees F). Keep this medicine in the original container. Keep tightly closed in a dry place. Do not remove the desiccant packet  from the bottle, it helps to protect your medicine from moisture. Throw away any unused medicine after the expiration date. NOTE: This sheet is a summary. It may not cover all possible information. If you have questions about this medicine, talk to your doctor, pharmacist, or health care provider.  2020 Elsevier/Gold Standard (2015-12-18 12:17:04) Chronic Constipation  Chronic constipation is a condition in which a person has three or fewer bowel movements a week, for three months or longer. This condition is especially common in older adults. The two main kinds of chronic constipation are secondary constipation and functional constipation. Secondary constipation results from another condition or a treatment. Functional constipation, also called primary or idiopathic constipation, is divided into three types:  Normal transit constipation. In this type, movement of stool through the colon (stool transit) occurs normally.  Slow transit constipation. In this type, stool moves slowly through the colon.  Outlet constipation or pelvic floor dysfunction. In this type, the nerves and muscles that empty the rectum do not work normally. What are the causes? Causes of secondary constipation may include:  Failing to drink enough fluid, eat enough food or fiber, or get physically active.  Pregnancy.  A tear in the anus (anal fissure).  Blockage in the bowel (bowel obstruction).  Narrowing of the bowel (bowel stricture).  Having a long-term medical condition, such as: ? Diabetes. ? Hypothyroidism. ? Multiple sclerosis. ? Parkinson disease. ? Stroke. ? Spinal cord injury. ? Dementia. ? Colon cancer. ? Inflammatory bowel disease (IBD). ? Iron-deficiency anemia. ? Outward collapse of the rectum (rectal prolapse). ? Hemorrhoids.  Taking certain medicines, including: ? Narcotics. These are a certain type of prescription pain medicine. ? Antacids. ? Iron supplements. ? Water pills  (diuretics). ? Certain blood pressure medicines. ? Anti-seizure medicines. ? Antidepressants. ? Medicines for Parkinson disease. The cause of functional constipation is not known, but some conditions are associated with it. These conditions include:  Stress.  Problems in the nerves and muscles that control stool transit.  Weak or impaired pelvic floor muscles. What increases the risk? You may be at higher risk for chronic constipation if you:  Are older than age 51.  Are female.  Live in a long-term care facility.  Do not get much exercise or physical activity (have a sedentary lifestyle).  Do not drink enough fluids.  Do not eat enough food, especially fiber.  Have a long-term disease.  Have a mental health disorder or eating disorder.  Take many medicines. What are the signs or symptoms? The main symptom of chronic constipation is having three or fewer bowel movements a week for several weeks. Other signs and symptoms may vary from person to person. These include:  Pushing hard (straining) to pass stool.  Painful bowel movements.  Having hard or lumpy stools.  Having lower belly discomfort, such as cramps or bloating.  Being unable to have a bowel movement when you feel the urge.  Feeling like you still need to pass stool after a bowel movement.  Feeling that you have something in your rectum that is blocking or preventing bowel movements.  Seeing blood on the toilet paper or  in your stool.  Worsening confusion (in older adults). How is this diagnosed? This condition may be diagnosed based on:  Symptoms and medical history. You will be asked about your symptoms, lifestyle, diet, and any medicines that you are taking.  Physical exam. ? Your belly (abdomen) will be examined. ? A digital rectal exam may be done. For this exam, a health care provider places a lubricated, gloved finger into the rectum.  Other tests to check for any underlying causes of your  constipation. These may be ordered if you have bleeding in your rectum, weight loss, or a family history of colon cancer. In these cases, you may have: ? Imaging studies of the colon. These may include X-ray, ultrasound, or CT scan. ? Blood tests. ? A procedure to examine the inside of your colon (colonoscopy). ? More specialized tests to check:  Whether your anal sphincter works well. This is a ring-shaped muscle that controls the closing of the anus.  How well food moves through your colon. ? Tests to measure the nerve signal in your pelvic floor muscles (electromyography). How is this treated? Treatment for chronic constipation depends on the cause. Most often, treatment starts with:  Being more active and getting regular exercise.  Drinking more fluids.  Adding fiber to your diet. Sources of fiber include fruits, vegetables, whole grains, and fiber supplements.  Using medicines such as stool softeners or medicines that increase contractions in your digestive system (pro-motility agents).  Training your pelvic muscles with biofeedback.  Surgery, if there is obstruction. Treatment for secondary chronic constipation depends on the underlying condition. You may need to:  Stop or change some medicines if they cause constipation.  Use a fiber supplement (bulk laxative) or stool softener.  Use prescription laxative. This works by [employer] into your colon (osmotic laxative). You may also need to see a specialist who treats conditions of the digestive system (gastroenterologist). Follow these instructions at home:   Take over-the-counter and prescription medicines only as told by your health care provider.  If you are taking a laxative, take it as told by your health care provider.  Eat a balanced diet that includes enough fiber. Ask your health care provider to recommend a diet that is right for you.  Drink clear fluids, especially water. Avoid drinking alcohol, caffeine,  and soda.  Drink enough fluid to keep your urine pale yellow.  Get some physical activity every day. Ask your health care provider what physical activities are safe for you.  Get colon cancer screenings as told by your health care provider.  Keep all follow-up visits as told by your health care provider. This is important. Contact a health care provider if:  You are having three or fewer bowel movements a week.  Your stools are hard or lumpy.  You notice blood on the toilet paper or in your stool after you have a bowel movement.  You have unexplained weight loss.  You have rectum (rectal) pain.  You have stool leakage.  You experience nausea or vomiting. Get help right away if:  You have rectal bleeding or you pass blood clots.  You have severe rectal pain.  You have body tissue that pushes out (protrudes) from your anus.  You have severe pain or bloating (distension) in your abdomen.  You have vomiting that you cannot control. Summary  Chronic constipation is a condition in which a person has three or fewer bowel movements a week, for three months or longer.  You may  have a higher risk for this condition if you are an older adult, or if you do not drink enough water or get enough physical activity (are sedentary).  Treatment for this condition depends on the cause. Most treatments for chronic constipation include adding fiber to your diet, drinking more fluids, and getting more physical activity. You may also need to treat any underlying medical conditions or stop or change certain medicines if they cause constipation.  If lifestyle changes do not relieve constipation, your health care provider may recommend taking a laxative. This information is not intended to replace advice given to you by your health care provider. Make sure you discuss any questions you have with your health care provider. Document Revised: 10/28/2017 Document Reviewed: 08/02/2017 Elsevier Patient  Education  2020 [employer].

## 2020-05-06 ENCOUNTER — Telehealth: Payer: Self-pay | Admitting: Physician Assistant

## 2020-05-06 NOTE — Telephone Encounter (Signed)
Received fax for PA on [employer]els[employer] sent through cover my meds waiting on determination. - CF

## 2020-06-06 ENCOUNTER — Telehealth: Payer: Self-pay | Admitting: Physician Assistant

## 2020-06-06 NOTE — Telephone Encounter (Signed)
Left message on machine for patient to call back and let us know if she was able to get medication.

## 2020-06-06 NOTE — Telephone Encounter (Signed)
Can we see if patient was able to get belsomra?

## 2020-06-09 NOTE — Telephone Encounter (Signed)
Patient called back and [facility] stating she has not gotten medication yet. She has not heard from pharmacy that anything is ready.

## 2020-06-10 NOTE — Telephone Encounter (Signed)
Arline Asp - note in chart from 05/06/2020 that [employer] PA submitted. Did you ever hear back??

## 2020-06-11 NOTE — Telephone Encounter (Signed)
Resent Prior authorization. - CF

## 2020-07-29 DIAGNOSIS — U071 COVID-19: Secondary | ICD-10-CM | POA: Diagnosis not present

## 2020-07-29 DIAGNOSIS — R5383 Other fatigue: Secondary | ICD-10-CM | POA: Diagnosis not present

## 2020-08-11 DIAGNOSIS — R0602 Shortness of breath: Secondary | ICD-10-CM | POA: Diagnosis not present

## 2020-08-25 NOTE — Telephone Encounter (Signed)
Medication denied. - CF

## 2020-08-26 ENCOUNTER — Ambulatory Visit
Admission: RE | Admit: 2020-08-26 | Discharge: 2020-08-26 | Disposition: A | Discharge status: Home / Self Care | Payer: BC Managed Care – PPO | Source: Ambulatory Visit | Attending: Physician Assistant | Admitting: Physician Assistant

## 2020-08-26 ENCOUNTER — Other Ambulatory Visit: Payer: Self-pay

## 2020-08-26 ENCOUNTER — Other Ambulatory Visit: Payer: Self-pay | Admitting: Physician Assistant

## 2020-08-26 DIAGNOSIS — R921 Mammographic calcification found on diagnostic imaging of breast: Secondary | ICD-10-CM

## 2020-08-26 DIAGNOSIS — H5213 Myopia, bilateral: Secondary | ICD-10-CM | POA: Diagnosis not present

## 2020-12-28 ENCOUNTER — Other Ambulatory Visit: Payer: Self-pay | Admitting: Physician Assistant

## 2020-12-28 DIAGNOSIS — I10 Essential (primary) hypertension: Secondary | ICD-10-CM

## 2021-01-15 ENCOUNTER — Other Ambulatory Visit: Payer: Self-pay | Admitting: Physician Assistant

## 2021-01-15 DIAGNOSIS — I10 Essential (primary) hypertension: Secondary | ICD-10-CM

## 2021-02-25 ENCOUNTER — Ambulatory Visit
Admission: RE | Admit: 2021-02-25 | Discharge: 2021-02-25 | Disposition: A | Discharge status: Home / Self Care | Payer: BC Managed Care – PPO | Source: Ambulatory Visit | Attending: Physician Assistant | Admitting: Physician Assistant

## 2021-02-25 ENCOUNTER — Other Ambulatory Visit: Payer: Self-pay

## 2021-02-25 DIAGNOSIS — R922 Inconclusive mammogram: Secondary | ICD-10-CM | POA: Diagnosis not present

## 2021-02-25 DIAGNOSIS — R921 Mammographic calcification found on diagnostic imaging of breast: Secondary | ICD-10-CM

## 2021-03-26 ENCOUNTER — Other Ambulatory Visit: Payer: Self-pay | Admitting: Physician Assistant

## 2021-03-26 DIAGNOSIS — I10 Essential (primary) hypertension: Secondary | ICD-10-CM

## 2021-05-26 DIAGNOSIS — M419 Scoliosis, unspecified: Secondary | ICD-10-CM | POA: Diagnosis not present

## 2021-05-26 DIAGNOSIS — Z3202 Encounter for pregnancy test, result negative: Secondary | ICD-10-CM | POA: Diagnosis not present

## 2021-05-26 DIAGNOSIS — M542 Cervicalgia: Secondary | ICD-10-CM | POA: Diagnosis not present

## 2021-05-26 DIAGNOSIS — M545 Low back pain, unspecified: Secondary | ICD-10-CM | POA: Diagnosis not present

## 2021-05-26 DIAGNOSIS — Z041 Encounter for examination and observation following transport accident: Secondary | ICD-10-CM | POA: Diagnosis not present

## 2021-05-26 DIAGNOSIS — M47812 Spondylosis without myelopathy or radiculopathy, cervical region: Secondary | ICD-10-CM | POA: Diagnosis not present

## 2021-05-26 DIAGNOSIS — R0789 Other chest pain: Secondary | ICD-10-CM | POA: Diagnosis not present

## 2021-06-02 ENCOUNTER — Ambulatory Visit: Payer: BC Managed Care – PPO | Admitting: Physician Assistant

## 2021-06-02 ENCOUNTER — Other Ambulatory Visit: Payer: Self-pay

## 2021-06-02 DIAGNOSIS — M62838 Other muscle spasm: Secondary | ICD-10-CM

## 2021-06-02 DIAGNOSIS — G4486 Cervicogenic headache: Secondary | ICD-10-CM

## 2021-06-02 DIAGNOSIS — F419 Anxiety disorder, unspecified: Secondary | ICD-10-CM

## 2021-06-02 DIAGNOSIS — M542 Cervicalgia: Secondary | ICD-10-CM | POA: Diagnosis not present

## 2021-06-02 DIAGNOSIS — M4120 Other idiopathic scoliosis, site unspecified: Secondary | ICD-10-CM

## 2021-06-02 MED ORDER — TRAMADOL HCL 50 MG PO TABS: 50.0000 mg | ORAL_TABLET | Freq: Four times a day (QID) | ORAL | 0 refills | Status: AC | PRN

## 2021-06-02 MED ORDER — METHOCARBAMOL 500 MG PO TABS: 500.0000 mg | ORAL_TABLET | Freq: Three times a day (TID) | ORAL | 0 refills | Status: DC

## 2021-06-02 MED ORDER — GABAPENTIN 100 MG PO CAPS: 100.0000 mg | ORAL_CAPSULE | Freq: Three times a day (TID) | ORAL | 0 refills | Status: DC

## 2021-06-02 MED ORDER — KETOROLAC TROMETHAMINE 60 MG/2ML IM SOLN
60.0000 mg | Freq: Once | INTRAMUSCULAR | Status: AC
  Administered 2021-06-02: 60 mg via INTRAMUSCULAR

## 2021-06-02 MED ORDER — MELOXICAM 15 MG PO TABS: 15.0000 mg | ORAL_TABLET | Freq: Every day | ORAL | 1 refills | Status: DC
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
## 2021-06-02 NOTE — Patient Instructions (Signed)
Will make referral to PT.  Cervical Strain and Sprain Rehab Ask your health care provider which exercises are safe for you. Do exercises exactly as told by your health care provider and adjust them as directed. It is normal to feel mild stretching, pulling, tightness, or discomfort as you do these exercises. Stop right away if you feel sudden pain or your pain gets worse. Do not begin these exercises until told by your health care provider. Stretching and range-of-motion exercises Cervical side bending  Using good posture, sit on a stable chair or stand up. Without moving your shoulders, slowly tilt your left / right ear to your shoulder until you feel a stretch in the opposite side neck muscles. You should be looking straight ahead. Hold for __________ seconds. Repeat with the other side of your neck. Repeat __________ times. Complete this exercise __________ times a day. Cervical rotation  Using good posture, sit on a stable chair or stand up. Slowly turn your head to the side as if you are looking over your left / right shoulder. Keep your eyes level with the ground. Stop when you feel a stretch along the side and the back of your neck. Hold for __________ seconds. Repeat this by turning to your other side. Repeat __________ times. Complete this exercise __________ times a day. Thoracic extension and pectoral stretch Roll a towel or a small blanket so it is about 4 inches (10 cm) in diameter. Lie down on your back on a firm surface. Put the towel lengthwise, under your spine in the middle of your back. It should not be under your shoulder blades. The towel should line up with your spine from your middle back to your lower back. Put your hands behind your head and let your elbows fall out to your sides. Hold for __________ seconds. Repeat __________ times. Complete this exercise __________ times a day. Strengthening exercises Isometric upper cervical flexion Lie on your back with a  thin pillow behind your head and a small rolled-up towel under your neck. Gently tuck your chin toward your chest and nod your head down to look toward your feet. Do not lift your head off the pillow. Hold for __________ seconds. Release the tension slowly. Relax your neck muscles completely before you repeat this exercise. Repeat __________ times. Complete this exercise __________ times a day. Isometric cervical extension  Stand about 6 inches (15 cm) away from a wall, with your back facing the wall. Place a soft object, about 6-8 inches (15-20 cm) in diameter, between the back of your head and the wall. A soft object could be a small pillow, a ball, or a folded towel. Gently tilt your head back and press into the soft object. Keep your jaw and forehead relaxed. Hold for __________ seconds. Release the tension slowly. Relax your neck muscles completely before you repeat this exercise. Repeat __________ times. Complete this exercise __________ times a day. Posture and body mechanics Body mechanics refers to the movements and positions of your body while you do your daily activities. Posture is part of body mechanics. Good posture and healthy body mechanics can help to relieve stress in your body's tissues and joints. Good posture means that your spine is in its natural S-curve position (your spine is neutral), your shoulders are pulled back slightly, and your head is not tipped forward. The following are general guidelines for applying improved posture andbody mechanics to your everyday activities. Sitting  When sitting, keep your spine neutral and keep your  feet flat on the floor. Use a footrest, if necessary, and keep your thighs parallel to the floor. Avoid rounding your shoulders, and avoid tilting your head forward. When working at a desk or a computer, keep your desk at a height where your hands are slightly lower than your elbows. Slide your chair under your desk so you are close enough to  maintain good posture. When working at a computer, place your monitor at a height where you are looking straight ahead and you do not have to tilt your head forward or downward to look at the screen.  Standing  When standing, keep your spine neutral and keep your feet about hip-width apart. Keep a slight bend in your knees. Your ears, shoulders, and hips should line up. When you do a task in which you stand in one place for a long time, place one foot up on a stable object that is 2-4 inches (5-10 cm) high, such as a footstool. This helps keep your spine neutral.  Resting When lying down and resting, avoid positions that are most painful for you. Try to support your neck in a neutral position. You can use a contour pillow or asmall rolled-up towel. Your pillow should support your neck but not push on it. This information is not intended to replace advice given to you by your health care provider. Make sure you discuss any questions you have with your healthcare provider. Document Revised: 03/07/2019 Document Reviewed: 08/16/2018 Elsevier Patient Education  2022 [employer].

## 2021-06-03 ENCOUNTER — Encounter: Payer: Self-pay | Admitting: Physician Assistant

## 2021-06-03 DIAGNOSIS — M542 Cervicalgia: Secondary | ICD-10-CM | POA: Insufficient documentation

## 2021-06-03 DIAGNOSIS — G4486 Cervicogenic headache: Secondary | ICD-10-CM | POA: Insufficient documentation

## 2021-06-03 DIAGNOSIS — M62838 Other muscle spasm: Secondary | ICD-10-CM | POA: Insufficient documentation

## 2021-06-03 DIAGNOSIS — F419 Anxiety disorder, unspecified: Secondary | ICD-10-CM | POA: Insufficient documentation

## 2021-06-03 DIAGNOSIS — M4120 Other idiopathic scoliosis, site unspecified: Secondary | ICD-10-CM | POA: Insufficient documentation

## 2021-06-04 ENCOUNTER — Encounter: Payer: Self-pay | Admitting: Rehabilitative and Restorative Service Providers"

## 2021-06-04 ENCOUNTER — Ambulatory Visit: Payer: BC Managed Care – PPO | Admitting: Rehabilitative and Restorative Service Providers"

## 2021-06-04 ENCOUNTER — Other Ambulatory Visit: Payer: Self-pay

## 2021-06-04 ENCOUNTER — Telehealth: Payer: Self-pay | Admitting: Physician Assistant

## 2021-06-04 DIAGNOSIS — R29898 Other symptoms and signs involving the musculoskeletal system: Secondary | ICD-10-CM | POA: Diagnosis not present

## 2021-06-04 DIAGNOSIS — M542 Cervicalgia: Secondary | ICD-10-CM

## 2021-06-04 DIAGNOSIS — R293 Abnormal posture: Secondary | ICD-10-CM | POA: Diagnosis not present

## 2021-06-04 DIAGNOSIS — G4486 Cervicogenic headache: Secondary | ICD-10-CM | POA: Diagnosis not present

## 2021-06-04 NOTE — Patient Instructions (Signed)
Access Code: [email or web address]: [email or web address]: 07/07/2022Prepared by: Donesha Wallander HoltExercises  Supine Chin Tuck - 2 x daily - 7 x weekly - 1 sets - 5-10 reps - 5-10 sec hold  Supine Cervical Rotation AROM on Pillow - 2 x daily - 7 x weekly - 1 sets - 5-10 reps - 5 sec hold  Supine Diaphragmatic Breathing - 2 x daily - 7 x weekly - 1 sets - 10 reps - 4-6 sec hold  Hooklying Shoulder T - 2 x daily - 7 x weekly - 1 sets - 1 reps - 2-5 min hold  Hooklying Single Knee to Chest Stretch - 2 x daily - 7 x weekly - 1 sets - 3-5 reps - 10-15 sec hold Patient Education  Trigger Point Dry Needling  TENS Unit  Posture and Body Mechanics
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
## 2021-06-09 ENCOUNTER — Ambulatory Visit (INDEPENDENT_AMBULATORY_CARE_PROVIDER_SITE_OTHER): Payer: BC Managed Care – PPO | Admitting: Physician Assistant

## 2021-06-09 ENCOUNTER — Ambulatory Visit: Payer: BC Managed Care – PPO | Admitting: Rehabilitative and Restorative Service Providers"

## 2021-06-09 ENCOUNTER — Encounter: Payer: Self-pay | Admitting: Rehabilitative and Restorative Service Providers"

## 2021-06-09 ENCOUNTER — Other Ambulatory Visit: Payer: Self-pay

## 2021-06-09 DIAGNOSIS — G4486 Cervicogenic headache: Secondary | ICD-10-CM

## 2021-06-09 DIAGNOSIS — M542 Cervicalgia: Secondary | ICD-10-CM | POA: Diagnosis not present

## 2021-06-09 DIAGNOSIS — M62838 Other muscle spasm: Secondary | ICD-10-CM

## 2021-06-09 DIAGNOSIS — R29898 Other symptoms and signs involving the musculoskeletal system: Secondary | ICD-10-CM | POA: Diagnosis not present

## 2021-06-09 DIAGNOSIS — R293 Abnormal posture: Secondary | ICD-10-CM

## 2021-06-09 MED ORDER — TIZANIDINE HCL 4 MG PO TABS: 4.0000 mg | ORAL_TABLET | Freq: Four times a day (QID) | ORAL | 0 refills | Status: DC | PRN

## 2021-06-09 MED ORDER — KETOROLAC TROMETHAMINE 60 MG/2ML IM SOLN
60.0000 mg | Freq: Once | INTRAMUSCULAR | Status: AC
  Administered 2021-06-09: 60 mg via INTRAMUSCULAR
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
## 2021-06-09 NOTE — Patient Instructions (Signed)
Access Code: [email or web address]: [email or web address]: 07/12/2022Prepared by: Deasia Chiu HoltExercises  Supine Chin Tuck - 2 x daily - 7 x weekly - 1 sets - 5-10 reps - 5-10 sec hold  Supine Cervical Rotation AROM on Pillow - 2 x daily - 7 x weekly - 1 sets - 5-10 reps - 5 sec hold  Supine Diaphragmatic Breathing - 2 x daily - 7 x weekly - 1 sets - 10 reps - 4-6 sec hold  Hooklying Shoulder T - 2 x daily - 7 x weekly - 1 sets - 1 reps - 2-5 min hold  Hooklying Single Knee to Chest Stretch - 2 x daily - 7 x weekly - 1 sets - 3-5 reps - 10-15 sec hold  Seated Cervical Retraction - 2 x daily - 7 x weekly - 1-2 sets - 5-10 reps - 10 sec hold  Seated Cervical Rotation AROM - 2 x daily - 7 x weekly - 1 sets - 5 reps - 2-3 sec hold  Seated Cervical Sidebending AROM - 2 x daily - 7 x weekly - 1 sets - 5 reps - 5-10 sec hold  Doorway Pec Stretch at 60 Degrees Abduction - 3 x daily - 7 x weekly - 3 reps - 1 sets  Doorway Pec Stretch at 90 Degrees Abduction - 3 x daily - 7 x weekly - 3 reps - 1 sets - 30 seconds hold  Doorway Pec Stretch at 120 Degrees Abduction - 3 x daily - 7 x weekly - 3 reps - 1 sets - 30 second hold hold Patient Education  Trigger Point Dry Needling
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
## 2021-06-09 NOTE — Patient Instructions (Signed)
Can increase gabapentin to 1-3 tablets as needed up to three times a day.  Continue tramadol for breakthrough pain.  Stop robaxin. Try xanaflex up to 3 times a day.

## 2021-06-09 NOTE — Progress Notes (Signed)
l °

## 2021-06-10 ENCOUNTER — Encounter: Payer: Self-pay | Admitting: Physician Assistant

## 2021-06-11 ENCOUNTER — Other Ambulatory Visit: Payer: Self-pay

## 2021-06-11 ENCOUNTER — Encounter: Payer: Self-pay | Admitting: Rehabilitative and Restorative Service Providers"

## 2021-06-11 ENCOUNTER — Ambulatory Visit: Payer: BC Managed Care – PPO | Admitting: Rehabilitative and Restorative Service Providers"

## 2021-06-11 DIAGNOSIS — R293 Abnormal posture: Secondary | ICD-10-CM

## 2021-06-11 DIAGNOSIS — R29898 Other symptoms and signs involving the musculoskeletal system: Secondary | ICD-10-CM

## 2021-06-11 DIAGNOSIS — G4486 Cervicogenic headache: Secondary | ICD-10-CM | POA: Diagnosis not present

## 2021-06-11 DIAGNOSIS — M542 Cervicalgia: Secondary | ICD-10-CM
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
## 2021-06-11 NOTE — Patient Instructions (Addendum)
Access Code: [email or web address]: [email or web address]: 07/14/2022Prepared by: Loranzo Desha HoltExercises  Supine Chin Tuck - 2 x daily - 7 x weekly - 1 sets - 5-10 reps - 5-10 sec hold  Supine Cervical Rotation AROM on Pillow - 2 x daily - 7 x weekly - 1 sets - 5-10 reps - 5 sec hold  Supine Diaphragmatic Breathing - 2 x daily - 7 x weekly - 1 sets - 10 reps - 4-6 sec hold  Hooklying Shoulder T - 2 x daily - 7 x weekly - 1 sets - 1 reps - 2-5 min hold  Hooklying Single Knee to Chest Stretch - 2 x daily - 7 x weekly - 1 sets - 3-5 reps - 10-15 sec hold  Seated Cervical Retraction - 2 x daily - 7 x weekly - 1-2 sets - 5-10 reps - 10 sec hold  Seated Cervical Rotation AROM - 2 x daily - 7 x weekly - 1 sets - 5 reps - 2-3 sec hold  Seated Cervical Sidebending AROM - 2 x daily - 7 x weekly - 1 sets - 5 reps - 5-10 sec hold  Doorway Pec Stretch at 60 Degrees Abduction - 3 x daily - 7 x weekly - 3 reps - 1 sets  Doorway Pec Stretch at 90 Degrees Abduction - 3 x daily - 7 x weekly - 3 reps - 1 sets - 30 seconds hold  Doorway Pec Stretch at 120 Degrees Abduction - 3 x daily - 7 x weekly - 3 reps - 1 sets - 30 second hold hold  Standing shoulder flexion wall slides - 2 x daily - 7 x weekly - 1 sets - 5-10 reps - 2-3 sec hold  Standing Shoulder Abduction Full Range - 2 x daily - 7 x weekly - 1 sets - 5-10 reps - 2-3 sec hold  Standing Backward Shoulder Rolls - 2 x daily - 7 x weekly - 1 sets - 10 reps - 1-2 sec hold  Supine Double Knee to Chest - 2 x daily - 7 x weekly - 1 sets - 3-5 reps - 10-15 sec hold  Supine Lower Trunk Rotation - 2 x daily - 7 x weekly - 1 sets - 3-5 reps - 20-30 sec hold

## 2021-06-16 ENCOUNTER — Other Ambulatory Visit: Payer: Self-pay

## 2021-06-16 ENCOUNTER — Encounter: Payer: Self-pay | Admitting: Rehabilitative and Restorative Service Providers"

## 2021-06-16 ENCOUNTER — Ambulatory Visit: Payer: BC Managed Care – PPO | Admitting: Rehabilitative and Restorative Service Providers"

## 2021-06-16 DIAGNOSIS — M542 Cervicalgia: Secondary | ICD-10-CM

## 2021-06-16 DIAGNOSIS — R29898 Other symptoms and signs involving the musculoskeletal system: Secondary | ICD-10-CM

## 2021-06-16 DIAGNOSIS — R293 Abnormal posture: Secondary | ICD-10-CM

## 2021-06-16 DIAGNOSIS — G4486 Cervicogenic headache: Secondary | ICD-10-CM
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
## 2021-06-16 NOTE — Patient Instructions (Signed)
Access Code: [email or web address]: [email or web address]: 07/19/2022Prepared by: Tyona Nilsen HoltExercises  Supine Chin Tuck - 2 x daily - 7 x weekly - 1 sets - 5-10 reps - 5-10 sec hold  Supine Cervical Rotation AROM on Pillow - 2 x daily - 7 x weekly - 1 sets - 5-10 reps - 5 sec hold  Supine Diaphragmatic Breathing - 2 x daily - 7 x weekly - 1 sets - 10 reps - 4-6 sec hold  Hooklying Shoulder T - 2 x daily - 7 x weekly - 1 sets - 1 reps - 2-5 min hold  Hooklying Single Knee to Chest Stretch - 2 x daily - 7 x weekly - 1 sets - 3-5 reps - 10-15 sec hold  Seated Cervical Retraction - 2 x daily - 7 x weekly - 1-2 sets - 5-10 reps - 10 sec hold  Seated Cervical Rotation AROM - 2 x daily - 7 x weekly - 1 sets - 5 reps - 2-3 sec hold  Seated Cervical Sidebending AROM - 2 x daily - 7 x weekly - 1 sets - 5 reps - 5-10 sec hold  Doorway Pec Stretch at 60 Degrees Abduction - 3 x daily - 7 x weekly - 3 reps - 1 sets  Doorway Pec Stretch at 90 Degrees Abduction - 3 x daily - 7 x weekly - 3 reps - 1 sets - 30 seconds hold  Doorway Pec Stretch at 120 Degrees Abduction - 3 x daily - 7 x weekly - 3 reps - 1 sets - 30 second hold hold  Standing shoulder flexion wall slides - 2 x daily - 7 x weekly - 1 sets - 5-10 reps - 2-3 sec hold  Standing Shoulder Abduction Full Range - 2 x daily - 7 x weekly - 1 sets - 5-10 reps - 2-3 sec hold  Standing Backward Shoulder Rolls - 2 x daily - 7 x weekly - 1 sets - 10 reps - 1-2 sec hold  Supine Double Knee to Chest - 2 x daily - 7 x weekly - 1 sets - 3-5 reps - 10-15 sec hold  Supine Lower Trunk Rotation - 2 x daily - 7 x weekly - 1 sets - 3-5 reps - 20-30 sec hold  Standing Bilateral Low Shoulder Row with Anchored Resistance - 2 x daily - 7 x weekly - 1-3 sets - 10 reps - 2-3 sec hold  Standing Shoulder Extension with Resistance - 2 x daily - 7 x weekly - 1-2 sets - 10 reps - 3 sec hold

## 2021-06-18 ENCOUNTER — Encounter: Payer: Self-pay | Admitting: Rehabilitative and Restorative Service Providers"

## 2021-06-18 ENCOUNTER — Other Ambulatory Visit: Payer: Self-pay

## 2021-06-18 ENCOUNTER — Ambulatory Visit: Payer: BC Managed Care – PPO | Admitting: Rehabilitative and Restorative Service Providers"

## 2021-06-18 DIAGNOSIS — G4486 Cervicogenic headache: Secondary | ICD-10-CM | POA: Diagnosis not present

## 2021-06-18 DIAGNOSIS — M542 Cervicalgia: Secondary | ICD-10-CM

## 2021-06-18 DIAGNOSIS — R29898 Other symptoms and signs involving the musculoskeletal system: Secondary | ICD-10-CM | POA: Diagnosis not present

## 2021-06-18 DIAGNOSIS — R293 Abnormal posture: Secondary | ICD-10-CM | POA: Diagnosis not present

## 2021-06-18 NOTE — Patient Instructions (Signed)
Home cervical traction with theraband in door - start with 5-10 min increased to 15-20 min  Can repeat several times/day
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
## 2021-06-19 ENCOUNTER — Ambulatory Visit: Payer: BC Managed Care – PPO | Admitting: Rehabilitative and Restorative Service Providers"

## 2021-06-19 ENCOUNTER — Encounter: Payer: Self-pay | Admitting: Rehabilitative and Restorative Service Providers"

## 2021-06-19 DIAGNOSIS — R29898 Other symptoms and signs involving the musculoskeletal system: Secondary | ICD-10-CM | POA: Diagnosis not present

## 2021-06-19 DIAGNOSIS — M542 Cervicalgia: Secondary | ICD-10-CM | POA: Diagnosis not present

## 2021-06-19 DIAGNOSIS — G4486 Cervicogenic headache: Secondary | ICD-10-CM | POA: Diagnosis not present

## 2021-06-19 DIAGNOSIS — R293 Abnormal posture: Secondary | ICD-10-CM
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
## 2021-06-23 ENCOUNTER — Ambulatory Visit: Payer: BC Managed Care – PPO | Admitting: Rehabilitative and Restorative Service Providers"

## 2021-06-23 ENCOUNTER — Ambulatory Visit: Payer: BC Managed Care – PPO | Admitting: Physician Assistant

## 2021-06-23 ENCOUNTER — Encounter: Payer: Self-pay | Admitting: Rehabilitative and Restorative Service Providers"

## 2021-06-23 ENCOUNTER — Other Ambulatory Visit: Payer: Self-pay

## 2021-06-23 VITALS — BP 139/88 | HR 95 | Ht 68.0 in | Wt 176.0 lb

## 2021-06-23 DIAGNOSIS — M542 Cervicalgia: Secondary | ICD-10-CM | POA: Diagnosis not present

## 2021-06-23 DIAGNOSIS — G4486 Cervicogenic headache: Secondary | ICD-10-CM

## 2021-06-23 DIAGNOSIS — M5412 Radiculopathy, cervical region: Secondary | ICD-10-CM | POA: Diagnosis not present

## 2021-06-23 DIAGNOSIS — R29898 Other symptoms and signs involving the musculoskeletal system: Secondary | ICD-10-CM

## 2021-06-23 DIAGNOSIS — M5030 Other cervical disc degeneration, unspecified cervical region: Secondary | ICD-10-CM | POA: Insufficient documentation

## 2021-06-23 DIAGNOSIS — R293 Abnormal posture: Secondary | ICD-10-CM | POA: Diagnosis not present

## 2021-06-23 MED ORDER — PREDNISONE 20 MG PO TABS
ORAL_TABLET | ORAL | 0 refills | Status: DC
Start: 2021-06-23 — End: 2021-09-18

## 2021-06-23 MED ORDER — HYDROCODONE-ACETAMINOPHEN 5-325 MG PO TABS: 1.0000 | ORAL_TABLET | Freq: Four times a day (QID) | ORAL | 0 refills | Status: AC | PRN
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
## 2021-06-23 NOTE — Patient Instructions (Addendum)
Stop ibuprofen/mobic. Start prednisone taper.  Can take norco for break through pain. No more than twice a day.  Ok for muscle relaxer just not at same time as pain medication or prednisone.  Will get MRI of neck.    To start if finish prednisone and still having headaches/symptoms. Gabapentin up to three times a day.

## 2021-06-24 ENCOUNTER — Encounter: Payer: Self-pay | Admitting: Physician Assistant

## 2021-06-27 ENCOUNTER — Ambulatory Visit (INDEPENDENT_AMBULATORY_CARE_PROVIDER_SITE_OTHER): Payer: BC Managed Care – PPO

## 2021-06-27 ENCOUNTER — Other Ambulatory Visit: Payer: Self-pay

## 2021-06-27 DIAGNOSIS — M542 Cervicalgia: Secondary | ICD-10-CM | POA: Diagnosis not present

## 2021-06-27 DIAGNOSIS — M5412 Radiculopathy, cervical region: Secondary | ICD-10-CM | POA: Diagnosis not present

## 2021-06-27 DIAGNOSIS — G4486 Cervicogenic headache: Secondary | ICD-10-CM

## 2021-06-29 ENCOUNTER — Encounter: Payer: Self-pay | Admitting: Rehabilitative and Restorative Service Providers"

## 2021-06-29 ENCOUNTER — Encounter: Payer: Self-pay | Admitting: Physician Assistant

## 2021-06-29 ENCOUNTER — Other Ambulatory Visit: Payer: Self-pay

## 2021-06-29 ENCOUNTER — Telehealth: Payer: Self-pay | Admitting: Physician Assistant

## 2021-06-29 ENCOUNTER — Ambulatory Visit (INDEPENDENT_AMBULATORY_CARE_PROVIDER_SITE_OTHER): Payer: BC Managed Care – PPO | Admitting: Rehabilitative and Restorative Service Providers"

## 2021-06-29 DIAGNOSIS — M542 Cervicalgia: Secondary | ICD-10-CM | POA: Diagnosis not present

## 2021-06-29 DIAGNOSIS — R293 Abnormal posture: Secondary | ICD-10-CM

## 2021-06-29 DIAGNOSIS — G4486 Cervicogenic headache: Secondary | ICD-10-CM

## 2021-06-29 DIAGNOSIS — R29898 Other symptoms and signs involving the musculoskeletal system: Secondary | ICD-10-CM | POA: Diagnosis not present
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
## 2021-06-29 NOTE — Patient Instructions (Signed)
Access Code: [email or web address] URL: [email or web address] Date: 06/29/2021 Prepared by: Corlis Leak  Exercises Supine Chin Tuck - 2 x daily - 7 x weekly - 1 sets - 5-10 reps - 5-10 sec hold Supine Cervical Rotation AROM on Pillow - 2 x daily - 7 x weekly - 1 sets - 5-10 reps - 5 sec hold Supine Diaphragmatic Breathing - 2 x daily - 7 x weekly - 1 sets - 10 reps - 4-6 sec hold Hooklying Shoulder T - 2 x daily - 7 x weekly - 1 sets - 1 reps - 2-5 min hold Hooklying Single Knee to Chest Stretch - 2 x daily - 7 x weekly - 1 sets - 3-5 reps - 10-15 sec hold Seated Cervical Retraction - 2 x daily - 7 x weekly - 1-2 sets - 5-10 reps - 10 sec hold Seated Cervical Rotation AROM - 2 x daily - 7 x weekly - 1 sets - 5 reps - 2-3 sec hold Seated Cervical Sidebending AROM - 2 x daily - 7 x weekly - 1 sets - 5 reps - 5-10 sec hold Doorway Pec Stretch at 60 Degrees Abduction - 3 x daily - 7 x weekly - 3 reps - 1 sets Doorway Pec Stretch at 90 Degrees Abduction - 3 x daily - 7 x weekly - 3 reps - 1 sets - 30 seconds hold Doorway Pec Stretch at 120 Degrees Abduction - 3 x daily - 7 x weekly - 3 reps - 1 sets - 30 second hold hold Standing shoulder flexion wall slides - 2 x daily - 7 x weekly - 1 sets - 5-10 reps - 2-3 sec hold Standing Shoulder Abduction Full Range - 2 x daily - 7 x weekly - 1 sets - 5-10 reps - 2-3 sec hold Standing Backward Shoulder Rolls - 2 x daily - 7 x weekly - 1 sets - 10 reps - 1-2 sec hold Supine Double Knee to Chest - 2 x daily - 7 x weekly - 1 sets - 3-5 reps - 10-15 sec hold Supine Lower Trunk Rotation - 2 x daily - 7 x weekly - 1 sets - 3-5 reps - 20-30 sec hold Standing Bilateral Low Shoulder Row with Anchored Resistance - 2 x daily - 7 x weekly - 1-3 sets - 10 reps - 2-3 sec hold Standing Shoulder Extension with Resistance - 2 x daily - 7 x weekly - 1-2 sets - 10 reps - 3 sec hold Seated Thoracic Lumbar Extension with Pectoralis Stretch - 2 x daily - 7 x weekly - 1 sets -  5-8 reps - 10 sec hold Seated Thoracic Flexion and Extension - 2 x daily - 7 x weekly - 1 sets - 3 reps - 20-30 sec hold Seated Thoracic Flexion and Rotation with Arms Crossed - 2 x daily - 7 x weekly - 1 sets - 3 reps - 20-30 sec hold Seated Thoracic Extension and Rotation with Reach - 2 x daily - 7 x weekly - 1 sets - 3 reps - 10 sec hold

## 2021-06-29 NOTE — Telephone Encounter (Signed)
Patient dropped off forms (FMLA?) to be filled out by provider. Attached a billing form and placed in provider box. AM

## 2021-06-30 ENCOUNTER — Telehealth: Payer: Self-pay | Admitting: Physician Assistant

## 2021-06-30 NOTE — Telephone Encounter (Signed)
Pt called. She states that she was told her FMLA paperwork was up front but I did not see it in the folder.  She refaxed  FMLA paperwork and I put it in Jade's box on 8/2. Thank you.

## 2021-07-01 ENCOUNTER — Encounter: Payer: Self-pay | Admitting: Physician Assistant

## 2021-07-01 NOTE — Telephone Encounter (Signed)
Called patient and let her know forms are up front and ready to be picked up and that there is no fee. AM

## 2021-07-01 NOTE — Telephone Encounter (Signed)
Form has been completed and delivered to [place] at the front desk. Per Lesly Rubenstein, no charge for form completion.

## 2021-07-02 ENCOUNTER — Encounter: Payer: BC Managed Care – PPO | Admitting: Rehabilitative and Restorative Service Providers"

## 2021-07-02 NOTE — Telephone Encounter (Signed)
Patient's appt with Lesly Rubenstein has been cancelled, and it was me that called patient letting her know paperwork was up front and ready to be picked up and I have faxed it for patient, patient's copy with successful fax is in accordion. AM

## 2021-07-04 ENCOUNTER — Encounter: Payer: Self-pay | Admitting: Rehabilitative and Restorative Service Providers"

## 2021-07-06 ENCOUNTER — Encounter: Payer: BC Managed Care – PPO | Admitting: Rehabilitative and Restorative Service Providers"

## 2021-07-07 ENCOUNTER — Ambulatory Visit: Payer: BC Managed Care – PPO | Admitting: Physician Assistant

## 2021-07-09 ENCOUNTER — Encounter: Payer: BC Managed Care – PPO | Admitting: Rehabilitative and Restorative Service Providers"

## 2021-07-10 ENCOUNTER — Ambulatory Visit: Payer: BC Managed Care – PPO | Admitting: Sports Medicine

## 2021-07-10 ENCOUNTER — Other Ambulatory Visit: Payer: Self-pay

## 2021-07-10 DIAGNOSIS — M5030 Other cervical disc degeneration, unspecified cervical region: Secondary | ICD-10-CM
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
## 2021-07-21 ENCOUNTER — Ambulatory Visit
Admission: RE | Admit: 2021-07-21 | Discharge: 2021-07-21 | Disposition: A | Discharge status: Home / Self Care | Payer: BC Managed Care – PPO | Source: Ambulatory Visit | Attending: Sports Medicine | Admitting: Sports Medicine

## 2021-07-21 ENCOUNTER — Other Ambulatory Visit: Payer: Self-pay

## 2021-07-21 DIAGNOSIS — M5030 Other cervical disc degeneration, unspecified cervical region: Secondary | ICD-10-CM

## 2021-07-21 DIAGNOSIS — M50323 Other cervical disc degeneration at C6-C7 level: Secondary | ICD-10-CM | POA: Diagnosis not present

## 2021-07-21 DIAGNOSIS — M47812 Spondylosis without myelopathy or radiculopathy, cervical region: Secondary | ICD-10-CM | POA: Diagnosis not present

## 2021-07-21 MED ORDER — TRIAMCINOLONE ACETONIDE 40 MG/ML IJ SUSP (RADIOLOGY)
60.0000 mg | Freq: Once | INTRAMUSCULAR | Status: AC
  Administered 2021-07-21: 60 mg via EPIDURAL

## 2021-07-21 MED ORDER — IOPAMIDOL (ISOVUE-M 300) INJECTION 61%
1.0000 mL | Freq: Once | INTRAMUSCULAR | Status: AC
  Administered 2021-07-21: 1 mL via EPIDURAL

## 2021-07-21 NOTE — Discharge Instructions (Signed)

## 2021-08-21 ENCOUNTER — Other Ambulatory Visit: Payer: Self-pay

## 2021-08-21 ENCOUNTER — Ambulatory Visit: Payer: BC Managed Care – PPO | Admitting: Sports Medicine

## 2021-08-21 DIAGNOSIS — M5030 Other cervical disc degeneration, unspecified cervical region: Secondary | ICD-10-CM | POA: Diagnosis not present

## 2021-08-21 MED ORDER — TRIAZOLAM 0.25 MG PO TABS: ORAL_TABLET | ORAL | 0 refills | Status: DC
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
## 2021-08-22 ENCOUNTER — Other Ambulatory Visit: Payer: Self-pay | Admitting: Physician Assistant

## 2021-08-22 DIAGNOSIS — I10 Essential (primary) hypertension: Secondary | ICD-10-CM

## 2021-08-25 ENCOUNTER — Other Ambulatory Visit: Payer: Self-pay | Admitting: Physician Assistant

## 2021-08-25 DIAGNOSIS — I10 Essential (primary) hypertension: Secondary | ICD-10-CM

## 2021-08-25 MED ORDER — LISINOPRIL 20 MG PO TABS: 20.0000 mg | ORAL_TABLET | Freq: Every day | ORAL | 0 refills | Status: AC

## 2021-08-25 NOTE — Telephone Encounter (Signed)
Please see pt's message concerning where she has been getting these refills from.  Tiajuana Amass, CMA

## 2021-08-26 ENCOUNTER — Ambulatory Visit
Admission: RE | Admit: 2021-08-26 | Discharge: 2021-08-26 | Disposition: A | Discharge status: Home / Self Care | Payer: BC Managed Care – PPO | Source: Ambulatory Visit | Attending: Sports Medicine | Admitting: Sports Medicine

## 2021-08-26 ENCOUNTER — Other Ambulatory Visit: Payer: Self-pay

## 2021-08-26 DIAGNOSIS — M47812 Spondylosis without myelopathy or radiculopathy, cervical region: Secondary | ICD-10-CM | POA: Diagnosis not present

## 2021-08-26 DIAGNOSIS — M5030 Other cervical disc degeneration, unspecified cervical region: Secondary | ICD-10-CM

## 2021-08-26 MED ORDER — IOPAMIDOL (ISOVUE-M 300) INJECTION 61%
1.0000 mL | Freq: Once | INTRAMUSCULAR | Status: AC
  Administered 2021-08-26: 1 mL via EPIDURAL

## 2021-08-26 MED ORDER — TRIAMCINOLONE ACETONIDE 40 MG/ML IJ SUSP (RADIOLOGY)
60.0000 mg | Freq: Once | INTRAMUSCULAR | Status: AC
  Administered 2021-08-26: 60 mg via EPIDURAL

## 2021-08-26 NOTE — Discharge Instructions (Signed)

## 2021-09-18 ENCOUNTER — Other Ambulatory Visit: Payer: Self-pay

## 2021-09-18 ENCOUNTER — Ambulatory Visit: Payer: BC Managed Care – PPO | Admitting: Sports Medicine

## 2021-09-18 DIAGNOSIS — M5030 Other cervical disc degeneration, unspecified cervical region: Secondary | ICD-10-CM

## 2021-09-18 MED ORDER — GABAPENTIN 300 MG PO CAPS: ORAL_CAPSULE | ORAL | 3 refills | Status: DC

## 2021-09-18 NOTE — Progress Notes (Signed)
    Procedures performed today:    None.  Independent interpretation of notes and tests performed by another provider:   None.  Brief History, Exam, Impression, and Recommendations:    DDD (degenerative disc disease), cervical Pleasant 45 year old female, multilevel cervical DDD, nothing overtly radicular. She is now had 2 cervical epidurals, she is had some relief after the first but none after the second. Endorses numbness and tingling in the neck, both upper shoulders but also all over her body. Adding gabapentin, I would like a second opinion from neurosurgery, just 1 to see if that C6-C7 disc is potentially operative. Return to see me in a month.    ___________________________________________ Ihor Austin. Benjamin Stain, M.D., ABFM., CAQSM. Primary Care and Sports Medicine [facility]  Adjunct Instructor of Family Medicine  [employer]

## 2021-09-18 NOTE — Assessment & Plan Note (Signed)
Pleasant 45 year old female, multilevel cervical DDD, nothing overtly radicular. She is now had 2 cervical epidurals, she is had some relief after the first but none after the second. Endorses numbness and tingling in the neck, both upper shoulders but also all over her body. Adding gabapentin, I would like a second opinion from neurosurgery, just 1 to see if that C6-C7 disc is potentially operative. Return to see me in a month.

## 2021-09-23 DIAGNOSIS — Z6826 Body mass index (BMI) 26.0-26.9, adult: Secondary | ICD-10-CM | POA: Diagnosis not present

## 2021-09-23 DIAGNOSIS — M47812 Spondylosis without myelopathy or radiculopathy, cervical region: Secondary | ICD-10-CM | POA: Diagnosis not present

## 2021-09-23 DIAGNOSIS — I10 Essential (primary) hypertension: Secondary | ICD-10-CM | POA: Diagnosis not present

## 2021-10-15 ENCOUNTER — Other Ambulatory Visit: Payer: Self-pay

## 2021-10-15 ENCOUNTER — Ambulatory Visit: Payer: BC Managed Care – PPO | Admitting: Sports Medicine

## 2021-10-15 DIAGNOSIS — M5030 Other cervical disc degeneration, unspecified cervical region: Secondary | ICD-10-CM

## 2021-10-15 MED ORDER — GABAPENTIN 600 MG PO TABS: 600.0000 mg | ORAL_TABLET | Freq: Four times a day (QID) | ORAL | 3 refills | Status: DC | PRN

## 2021-10-15 MED ORDER — METHOCARBAMOL 750 MG PO TABS: 750.0000 mg | ORAL_TABLET | Freq: Every day | ORAL | 3 refills | Status: DC

## 2021-10-15 NOTE — Assessment & Plan Note (Signed)
This is a pleasant 45 year old female, multilevel cervical DDD, nothing overtly radicular, mostly radiation to the neck, shoulders, periscapular region. We added some gabapentin, this was after moderate effect from 2 cervical epidurals. We also got an opinion from neurosurgery, nothing immediately surgical, gabapentin seems to be working well, and the neurosurgeon added methocarbamol. We will do gabapentin 600 mg to be taken up to 4 times a day, methocarbamol at night, and we can continue this long-term. Trigger point injections are an option as well and if she uptitrate her dosages to the point where she experiences sedation but insufficient relief of pain that would be the indication for trigger points injections. Return to see me as needed.

## 2021-10-15 NOTE — Progress Notes (Signed)
    Procedures performed today:    None.  Independent interpretation of notes and tests performed by another provider:   None.  Brief History, Exam, Impression, and Recommendations:    DDD (degenerative disc disease), cervical This is a pleasant 45 year old female, multilevel cervical DDD, nothing overtly radicular, mostly radiation to the neck, shoulders, periscapular region. We added some gabapentin, this was after moderate effect from 2 cervical epidurals. We also got an opinion from neurosurgery, nothing immediately surgical, gabapentin seems to be working well, and the neurosurgeon added methocarbamol. We will do gabapentin 600 mg to be taken up to 4 times a day, methocarbamol at night, and we can continue this long-term. Trigger point injections are an option as well and if she uptitrate her dosages to the point where she experiences sedation but insufficient relief of pain that would be the indication for trigger points injections. Return to see me as needed.    ___________________________________________ Ihor Austin. Benjamin Stain, M.D., ABFM., CAQSM. Primary Care and Sports Medicine [facility]  Adjunct Instructor of Family Medicine  [employer]

## 2022-01-31 DIAGNOSIS — H5213 Myopia, bilateral: Secondary | ICD-10-CM | POA: Diagnosis not present

## 2022-02-19 ENCOUNTER — Encounter: Payer: Self-pay | Admitting: Neurology

## 2022-02-19 NOTE — Telephone Encounter (Signed)
Mychart message sent to patient.

## 2022-02-25 ENCOUNTER — Other Ambulatory Visit: Payer: Self-pay | Admitting: Family Medicine

## 2022-02-25 DIAGNOSIS — Z1231 Encounter for screening mammogram for malignant neoplasm of breast: Secondary | ICD-10-CM

## 2022-02-26 ENCOUNTER — Telehealth (INDEPENDENT_AMBULATORY_CARE_PROVIDER_SITE_OTHER): Payer: BC Managed Care – PPO | Admitting: Sports Medicine

## 2022-02-26 DIAGNOSIS — M5030 Other cervical disc degeneration, unspecified cervical region: Secondary | ICD-10-CM | POA: Diagnosis not present

## 2022-02-26 MED ORDER — GABAPENTIN 800 MG PO TABS
1600.0000 mg | ORAL_TABLET | Freq: Two times a day (BID) | ORAL | 11 refills | Status: AC
Start: 2022-02-26 — End: ?

## 2022-02-26 MED ORDER — CYCLOBENZAPRINE HCL 10 MG PO TABS: ORAL_TABLET | ORAL | 0 refills | Status: DC

## 2022-02-26 MED ORDER — CELECOXIB 200 MG PO CAPS: ORAL_CAPSULE | ORAL | 2 refills | Status: DC

## 2022-02-26 NOTE — Assessment & Plan Note (Signed)
Pleasant 46 year old female, multilevel cervical DDD, nothing overtly radicular but mostly suboccipital posterior neck pain, historically radiating to the neck, shoulders, periscapular region but now just suboccipital. ?Gabapentin 600 4 times daily has helped to some degree, she also had moderate effect from 2 cervical epidurals. ?We did get a neurosurgical opinion, there is nothing immediately surgical. ?In addition surgical intervention would take at least a 4 level fusion. ?Today we increased her gabapentin to 800 mg 2 tabs in the morning, 2 tabs in the evening, we switched her from methocarbamol to Flexeril, and added Celebrex, follow-up with me in 4 weeks, trigger point injections if not better. ?

## 2022-02-26 NOTE — Progress Notes (Signed)
? ?  Virtual Visit via Telephone ?  ?I connected with  Sherry Curtis  on 02/26/22 by telephone/telehealth and verified that I am speaking with the correct person using two identifiers. ?  ?I discussed the limitations, risks, security and privacy concerns of performing an evaluation and management service by telephone, including the higher likelihood of inaccurate diagnosis and treatment, and the availability of in person appointments.  We also discussed the likely need of an additional face to face encounter for complete and high quality delivery of care.  I also discussed with the patient that there may be a patient responsible charge related to this service. The patient expressed understanding and wishes to proceed. ? ?Provider location is in medical facility. ?Patient location is at their home, different from provider location. ?People involved in care of the patient during this telehealth encounter were myself, my nurse/medical assistant, and my front office/scheduling team member. ? ?Review of Systems: No fevers, chills, night sweats, weight loss, chest pain, or shortness of breath.  ? ?Objective Findings:   ? ?General: Speaking full sentences, no audible heavy breathing.  Sounds alert and appropriately interactive.   ? ?Independent interpretation of tests performed by another provider:  ? ?None. ? ?Brief History, Exam, Impression, and Recommendations:   ? ?DDD (degenerative disc disease), cervical ?Pleasant 46 year old female, multilevel cervical DDD, nothing overtly radicular but mostly suboccipital posterior neck pain, historically radiating to the neck, shoulders, periscapular region but now just suboccipital. ?Gabapentin 600 4 times daily has helped to some degree, she also had moderate effect from 2 cervical epidurals. ?We did get a neurosurgical opinion, there is nothing immediately surgical. ?In addition surgical intervention would take at least a 4 level fusion. ?Today we increased her  gabapentin to 800 mg 2 tabs in the morning, 2 tabs in the evening, we switched her from methocarbamol to Flexeril, and added Celebrex, follow-up with me in 4 weeks, trigger point injections if not better. ? ? ?I discussed the above assessment and treatment plan with the patient. The patient was provided an opportunity to ask questions and all were answered. The patient agreed with the plan and demonstrated an understanding of the instructions. ?  ?The patient was advised to call back or seek an in-person evaluation if the symptoms worsen or if the condition fails to improve as anticipated. ?  ?I provided 30 minutes of verbal and non-verbal time during this encounter date, time was needed to gather information, review chart, records, communicate/coordinate with staff remotely, as well as complete documentation. ? ? ?___________________________________________ ?Ihor Austin. Benjamin Stain, M.D., ABFM., CAQSM. ?Primary Care and Sports Medicine ?[facility] ? ?Adjunct Professor of Family Medicine  ?[employer] ?

## 2022-03-10 ENCOUNTER — Telehealth: Payer: Self-pay | Admitting: Family Medicine

## 2022-03-10 NOTE — Telephone Encounter (Signed)
Pls call patient remind her she is due for her 1 year follow-up for diagnostic mammogram.  The imaging department should have mailed her letter to remind her she just needs to call that phone number and get scheduled. ?

## 2022-03-11 NOTE — Telephone Encounter (Signed)
Called Sherry Curtis and informed her that she will need to call the breast center [phone] to schedule a f/u diagnostic mammogram.  ? ?Sherry Curtis was at work so sent her a my chart message with this information. ?

## 2022-03-15 ENCOUNTER — Other Ambulatory Visit: Payer: Self-pay | Admitting: Physician Assistant

## 2022-03-15 DIAGNOSIS — Z1231 Encounter for screening mammogram for malignant neoplasm of breast: Secondary | ICD-10-CM

## 2022-03-15 DIAGNOSIS — R921 Mammographic calcification found on diagnostic imaging of breast: Secondary | ICD-10-CM

## 2022-03-23 ENCOUNTER — Other Ambulatory Visit: Payer: Self-pay | Admitting: Physician Assistant

## 2022-03-23 DIAGNOSIS — R921 Mammographic calcification found on diagnostic imaging of breast: Secondary | ICD-10-CM

## 2022-03-25 ENCOUNTER — Ambulatory Visit
Admission: RE | Admit: 2022-03-25 | Discharge: 2022-03-25 | Disposition: A | Discharge status: Home / Self Care | Payer: BC Managed Care – PPO | Source: Ambulatory Visit | Attending: Physician Assistant | Admitting: Physician Assistant

## 2022-03-25 DIAGNOSIS — R921 Mammographic calcification found on diagnostic imaging of breast: Secondary | ICD-10-CM

## 2022-03-26 NOTE — Progress Notes (Signed)
No evidence of malignancy with in either breast. Screening mammogram in one year.

## 2022-05-26 DIAGNOSIS — Z1211 Encounter for screening for malignant neoplasm of colon: Secondary | ICD-10-CM | POA: Diagnosis not present
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
## 2023-03-26 ENCOUNTER — Other Ambulatory Visit: Payer: Self-pay

## 2023-03-26 ENCOUNTER — Ambulatory Visit
Admission: EM | Admit: 2023-03-26 | Discharge: 2023-03-26 | Disposition: A | Discharge status: Home / Self Care | Payer: BC Managed Care – PPO | Attending: Family Medicine | Admitting: Family Medicine

## 2023-03-26 ENCOUNTER — Encounter: Payer: Self-pay | Admitting: Emergency Medicine

## 2023-03-26 ENCOUNTER — Ambulatory Visit (INDEPENDENT_AMBULATORY_CARE_PROVIDER_SITE_OTHER): Payer: BC Managed Care – PPO

## 2023-03-26 DIAGNOSIS — M25562 Pain in left knee: Secondary | ICD-10-CM

## 2023-03-26 DIAGNOSIS — M25462 Effusion, left knee: Secondary | ICD-10-CM

## 2023-03-26 DIAGNOSIS — M1712 Unilateral primary osteoarthritis, left knee: Secondary | ICD-10-CM | POA: Insufficient documentation

## 2023-03-26 LAB — SYNOVIAL CELL COUNT + DIFF, W/ CRYSTALS
Crystals, Fluid: NONE SEEN
Eosinophils-Synovial: 0 % (ref 0–1)
Lymphocytes-Synovial Fld: 15 % (ref 0–20)
Monocyte-Macrophage-Synovial Fluid: 81 % (ref 50–90)
Neutrophil, Synovial: 4 % (ref 0–25)
WBC, Synovial: 138 /mm3 (ref 0–200)

## 2023-03-26 MED ORDER — ACETAMINOPHEN 325 MG PO TABS
650.0000 mg | ORAL_TABLET | Freq: Once | ORAL | Status: AC
  Administered 2023-03-26: 650 mg via ORAL

## 2023-03-26 MED ORDER — HYDROCODONE-ACETAMINOPHEN 5-325 MG PO TABS: 1.0000 | ORAL_TABLET | Freq: Four times a day (QID) | ORAL | 0 refills | Status: AC | PRN

## 2023-03-26 NOTE — ED Triage Notes (Signed)
Left  knee pain and swelling x 3 weeks  Denies injury  Pt has  iced  & elevated - min relief Pt saw her pcp 2 weeks ago - told to use ice & heat along with a "fluid pill" & a pain pill - min relief

## 2023-03-26 NOTE — Discharge Instructions (Addendum)
Use ice for 20 minutes every couple of hours Limit walking while knee is painful Wear Ace wrap for a couple of days See a sports medicine doctor, like Dr. Benjamin Stain in [place] or Dr. Jordan Likes in [facility] you do not see improvement by next week  Take hydrocodone if pain severe.  Do not drive on hydrocodone

## 2023-03-26 NOTE — ED Notes (Signed)
Call to microbiology to check on collection container needed for synovial fluid. This RN spoke w/ Lanora Manis in the lab- ok to use a sterile specimen container

## 2023-03-27 DIAGNOSIS — M25462 Effusion, left knee: Secondary | ICD-10-CM | POA: Diagnosis not present

## 2023-03-27 DIAGNOSIS — M1712 Unilateral primary osteoarthritis, left knee: Secondary | ICD-10-CM | POA: Diagnosis not present

## 2023-03-27 DIAGNOSIS — M25562 Pain in left knee: Secondary | ICD-10-CM | POA: Diagnosis not present
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
## 2023-03-27 NOTE — Progress Notes (Signed)
Patient called by this RN to update. Results are normal per Dr Delton See. Pt will follow up with her PCP & Dr  T.

## 2024-07-31 ENCOUNTER — Encounter: Payer: Self-pay | Admitting: Sports Medicine
